# Patient Record
Sex: Female | Born: 1990 | Race: White | Hispanic: No | Marital: Single | Smoking: Never smoker
Health system: Southern US, Community
[De-identification: ages and names within clinical notes are randomized; demographics above are authoritative.]

## PROBLEM LIST (undated history)

## (undated) DIAGNOSIS — R569 Unspecified convulsions: Secondary | ICD-10-CM

## (undated) DIAGNOSIS — G43909 Migraine, unspecified, not intractable, without status migrainosus: Secondary | ICD-10-CM

## (undated) DIAGNOSIS — F840 Autistic disorder: Secondary | ICD-10-CM

## (undated) HISTORY — DX: Migraine, unspecified, not intractable, without status migrainosus: G43.909

## (undated) HISTORY — PX: WISDOM TOOTH EXTRACTION: SHX21

## (undated) HISTORY — PX: TOE SURGERY: SHX1073

## (undated) HISTORY — PX: MOUTH SURGERY: SHX715

## (undated) HISTORY — DX: Unspecified convulsions: R56.9

## (undated) HISTORY — DX: Autistic disorder: F84.0

---
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
## 2010-02-02 ENCOUNTER — Emergency Department (HOSPITAL_COMMUNITY): Admission: EM | Admit: 2010-02-02 | Discharge: 2010-02-02 | Payer: Self-pay | Admitting: Emergency Medicine

## 2010-07-13 LAB — DIFFERENTIAL
Basophils Absolute: 0 10*3/uL (ref 0.0–0.1)
Basophils Relative: 0 % (ref 0–1)
Eosinophils Absolute: 0.1 10*3/uL (ref 0.0–0.7)
Eosinophils Relative: 1 % (ref 0–5)
Lymphocytes Relative: 17 % (ref 12–46)
Lymphs Abs: 1.9 10*3/uL (ref 0.7–4.0)
Monocytes Absolute: 0 10*3/uL — ABNORMAL LOW (ref 0.1–1.0)
Monocytes Relative: 0 % — ABNORMAL LOW (ref 3–12)
Neutro Abs: 9.3 10*3/uL — ABNORMAL HIGH (ref 1.7–7.7)
Neutrophils Relative %: 82 % — ABNORMAL HIGH (ref 43–77)

## 2010-07-13 LAB — COMPREHENSIVE METABOLIC PANEL
ALT: 13 U/L (ref 0–35)
AST: 20 U/L (ref 0–37)
Albumin: 3.9 g/dL (ref 3.5–5.2)
Alkaline Phosphatase: 61 U/L (ref 39–117)
BUN: 8 mg/dL (ref 6–23)
CO2: 22 mEq/L (ref 19–32)
Calcium: 7.9 mg/dL — ABNORMAL LOW (ref 8.4–10.5)
Chloride: 114 mEq/L — ABNORMAL HIGH (ref 96–112)
Creatinine, Ser: 0.69 mg/dL (ref 0.4–1.2)
GFR calc Af Amer: >60 mL/min (ref >60)
GFR calc non Af Amer: >60 mL/min (ref >60)
Glucose, Bld: 170 mg/dL — ABNORMAL HIGH (ref 70–99)
Potassium: 3.9 mEq/L (ref 3.5–5.1)
Sodium: 140 mEq/L (ref 135–145)
Total Bilirubin: 0.2 mg/dL — ABNORMAL LOW (ref 0.3–1.2)
Total Protein: 6.7 g/dL (ref 6.0–8.3)

## 2010-07-13 LAB — CBC
HCT: 40.7 % (ref 36.0–46.0)
Hemoglobin: 13.7 g/dL (ref 12.0–15.0)
MCH: 30.5 pg (ref 26.0–34.0)
MCHC: 33.7 g/dL (ref 30.0–36.0)
MCV: 90.6 fL (ref 78.0–100.0)
Platelets: 279 10*3/uL (ref 150–400)
RBC: 4.49 MIL/uL (ref 3.87–5.11)
RDW: 12.8 % (ref 11.5–15.5)
WBC: 11.4 10*3/uL — ABNORMAL HIGH (ref 4.0–10.5)

## 2010-07-13 LAB — URINALYSIS, ROUTINE W REFLEX MICROSCOPIC
Bilirubin Urine: NEGATIVE
Glucose, UA: NEGATIVE mg/dL
Ketones, ur: NEGATIVE mg/dL
Leukocytes, UA: NEGATIVE
Nitrite: NEGATIVE
Protein, ur: NEGATIVE mg/dL
Specific Gravity, Urine: 1.014 (ref 1.005–1.030)
Urobilinogen, UA: 0.2 mg/dL (ref 0.0–1.0)
pH: 6 (ref 5.0–8.0)

## 2010-07-13 LAB — POCT PREGNANCY, URINE: Preg Test, Ur: NEGATIVE

## 2010-07-13 LAB — URINE MICROSCOPIC-ADD ON

## 2010-08-08 LAB — URINE CULTURE: Colony Count: >=100000

## 2010-08-08 LAB — URINALYSIS, ROUTINE W REFLEX MICROSCOPIC
Bilirubin Urine: NEGATIVE
Glucose, UA: NEGATIVE mg/dL
Hgb urine dipstick: NEGATIVE
Ketones, ur: NEGATIVE mg/dL
Nitrite: NEGATIVE
Protein, ur: NEGATIVE mg/dL
Specific Gravity, Urine: 1.012 (ref 1.005–1.030)
Urobilinogen, UA: 0.2 mg/dL (ref 0.0–1.0)
pH: 7 (ref 5.0–8.0)

## 2010-08-08 LAB — RAPID STREP SCREEN (MED CTR MEBANE ONLY): Streptococcus, Group A Screen (Direct): NEGATIVE

## 2010-09-15 NOTE — Discharge Summary (Signed)
[name], [name] NO.:  [id number]   MEDICAL RECORD NO.:  [id number]          PATIENT TYPE:  INP   LOCATION:  6119                         FACILITY:  [facility]   PHYSICIAN:  Broadus John T. Pickard II, MDDATE OF BIRTH:  26-Jun-1990   DATE OF ADMISSION:  09/21/2004  DATE OF DISCHARGE:  09/23/2004                                 DISCHARGE SUMMARY   PRIMARY CARE PHYSICIAN:  Dr. Dara Hoyer.   CONSULTATIONS:  Dr. Sharene Skeans of pediatric neurology.   HOSPITAL COURSE:  Patient is a 20 year old white female with a past medical  history significant for seizure disorder and autism, who was admitted with  seizures/status epilepticus and fever.  The patient had apparently missed  one of her Topamax doses and had a fever that day prior to the seizure  occurring.  Presented to the emergency department with 20-30 minutes of  seizure activity.  The seizure was finally broken with three doses of Ativan  and fosphenytoin load.  The patient was then resumed on Topamax 100 mg p.o.  b.i.d.  The patient remained seizure-free for greater than 24 hours prior to  discharge with strict and scheduled Tylenol and Motrin for fever control and  Topamax dose being resumed.  Dr. Sharene Skeans was consulted and recommended  continuation of the current Topamax dose and one-week followup as an  outpatient once the Topamax level had returned.  As far as workup for fever,  blood cultures were no growth to date, and urinalysis was initially normal.  Urine culture is pending but anticipate there to be no growth.  Chest x-ray  is negative.  A rapid strep test is pending at the time of this discharge  summary.  The patient was likely thought to have a viral illness,  considering she had multiple sick contacts with similar symptoms previously.  The patient was then discontinued on scheduled Tylenol and Motrin and  observed throughout the day to insure she had good fever control without the  scheduled antipyretics.   The patient had also been started on Clindamycin  for concern of possible aspiration pneumonia upon admission.  The  Clindamycin was discontinued the day of discharge, and the patient was  observed again another eight hours to insure the patient did not have  decompensation without the antibiotics.   OPERATIONS/PROCEDURES:  1.  Chest x-ray:  Negative.  2.  Blood culture:  No growth to date.  3.  Urinalysis was negative.  Urine cultures pending at the time of this      dictation but anticipated to be negative, considering a normal      urinalysis.  There is a Topamax level pending at the time of this      dictation and will be followed up by Dr. Sharene Skeans as an outpatient.      There is a rapid strep test that is pending at the time of this      dictation that will be followed up prior to discharge.   DIAGNOSES:  1.  Seizure disorder.  2.  Febrile illness.  3.  Cutaneous candidiasis.   MEDICATIONS:  1.  Topamax  100 mg p.o. b.i.d.  2.  Statin cream, apply t.i.d. to genital area and the affected areas.   DISCHARGE WEIGHT:  56 kg.   DISCHARGE CONDITION:  Improved.   DISCHARGE INSTRUCTIONS:  Patient is instructed to call Dr. Sharene Skeans and  schedule a follow-up appointment in 1-2 weeks.  At that appointment, Dr.  Sharene Skeans will evaluate the Topamax level and consider possibly increasing  her maintenance Topamax dose.  Dr. Sharene Skeans may also consider doing an MRI  at that point to evaluate for structural lesions in the brain.  The patient  is also instructed to call Dr. Marzetta Board office and arrange followup in the  next 3-4 weeks, as convenient.      WTP/MEDQ  D:  09/23/2004  T:  09/23/2004  Job:  [id number]   cc:   Deanna Artis. Sharene Skeans, M.D.  [address]  [address]  [place]  [place] [address]  Fax: [phone]   Teena Irani. Arlyce Dice, M.D.  [address]  [place]  [place] [address]  Fax: [phone]

## 2011-07-20 IMAGING — CT CT HEAD W/O CM
1 of 2 series · 16 of 30 positions shown, 20 images · non-contrast
Comparison: Head CT 05/20/2004.

CLINICAL DATA: Seizure.

CT HEAD WITHOUT CONTRAST
TECHNIQUE: Contiguous axial images were obtained from the base of
the skull through the vertex without contrast.

[Series 2: head_seq 4.5 h37s st · axial · 0.43mm/px · z∈[-114,+12]mm · 16 of 32 slices shown, 20 images]
[im 2/32  brain]
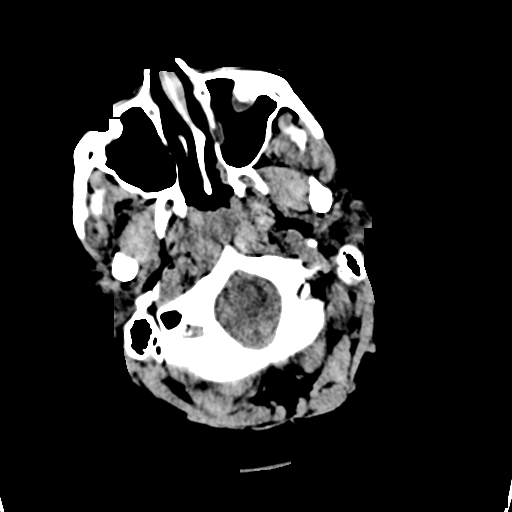
[im 2/32  bone]
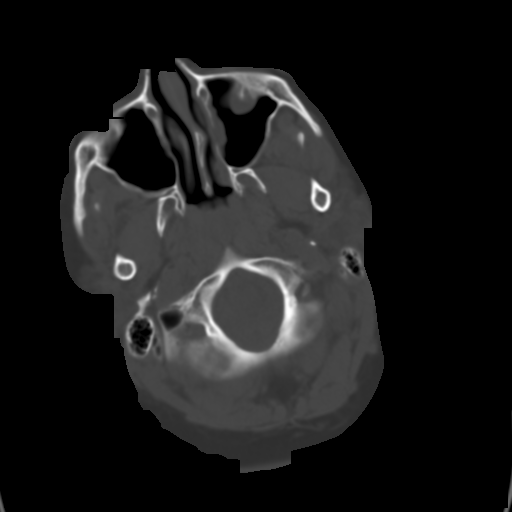
[im 3/32  brain]
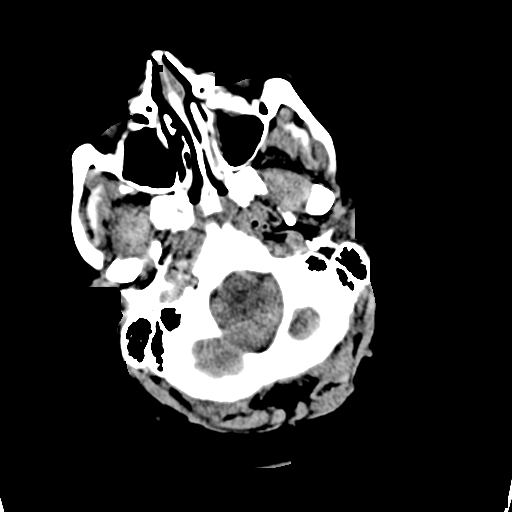
[im 6/32  brain]
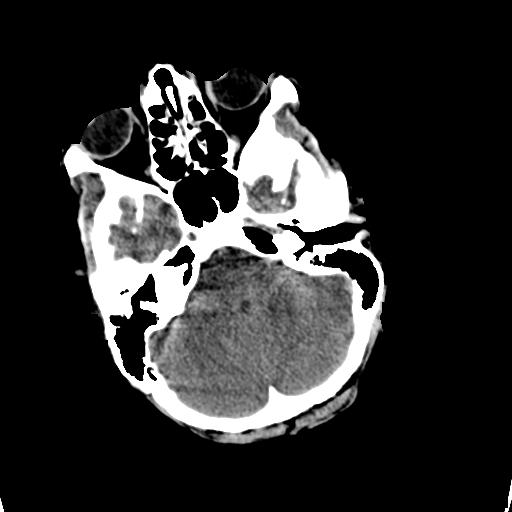
[im 8/32  brain]
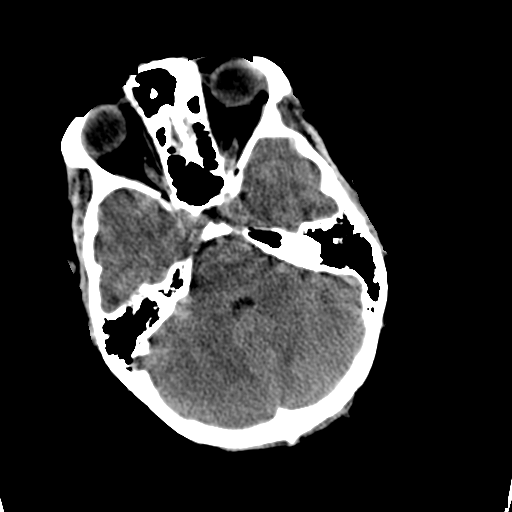
[im 9/32  brain]
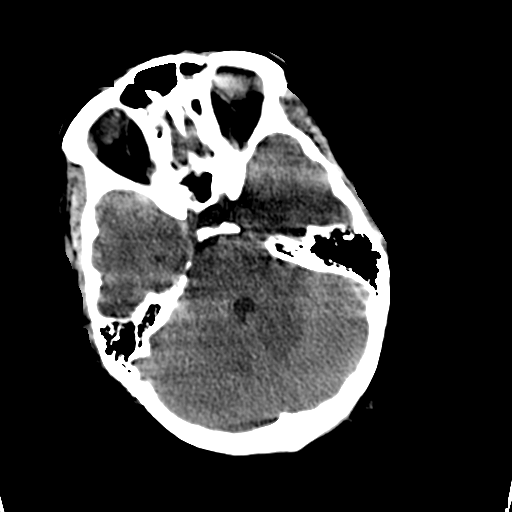
[im 9/32  bone]
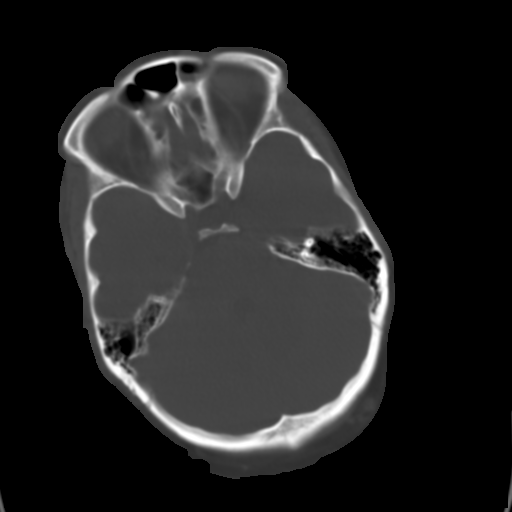
[im 11/32  brain]
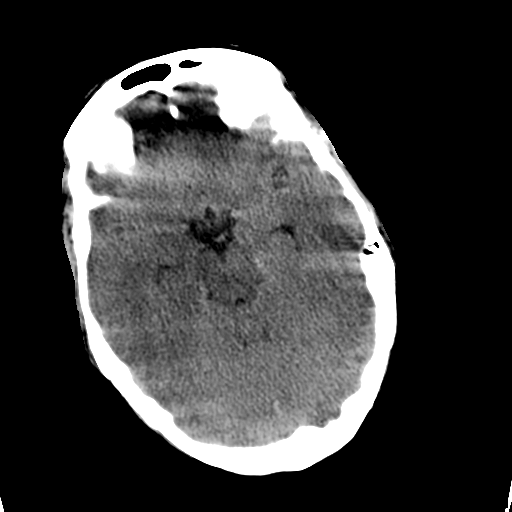
[im 14/32  brain]
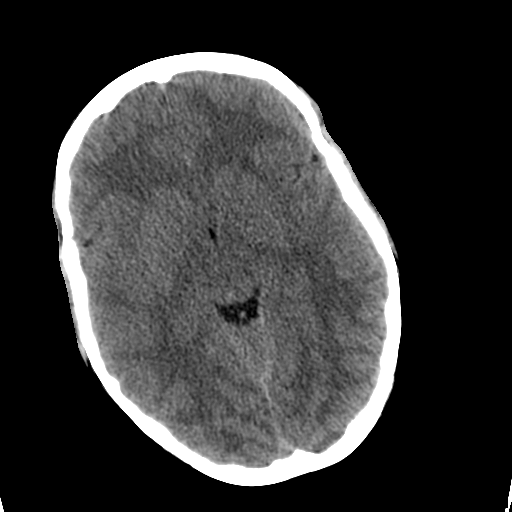
[im 15/32  brain]
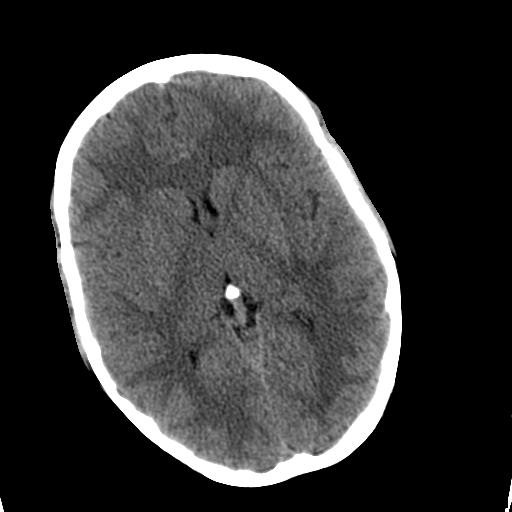
[im 17/32  brain]
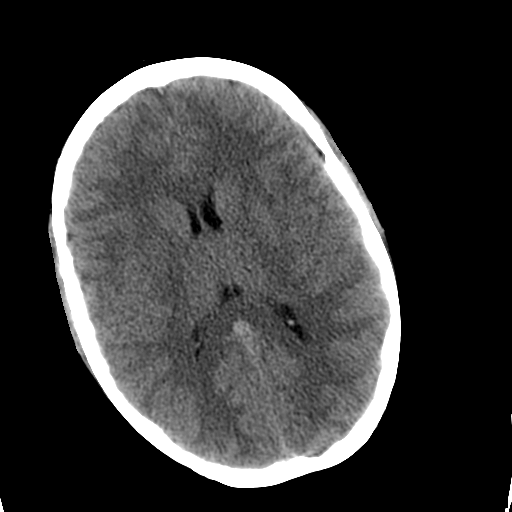
[im 17/32  bone]
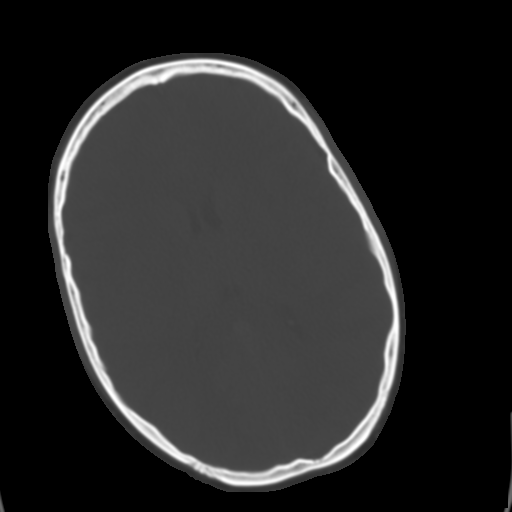
[im 18/32  brain]
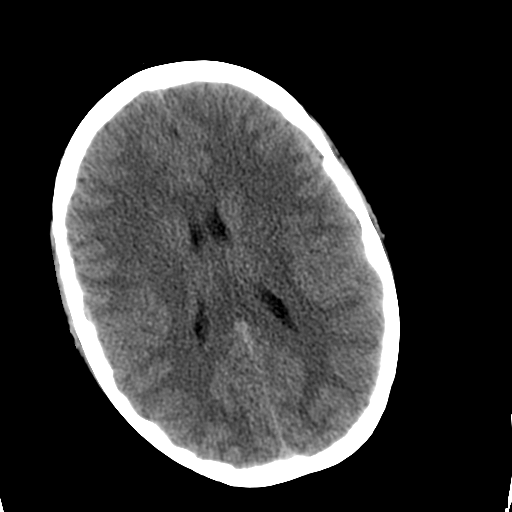
[im 21/32  brain]
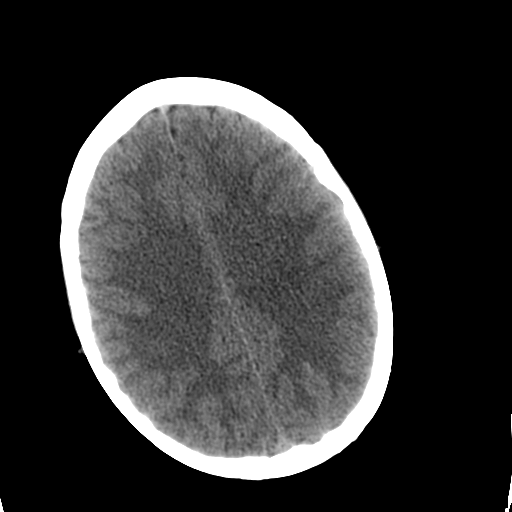
[im 23/32  brain]
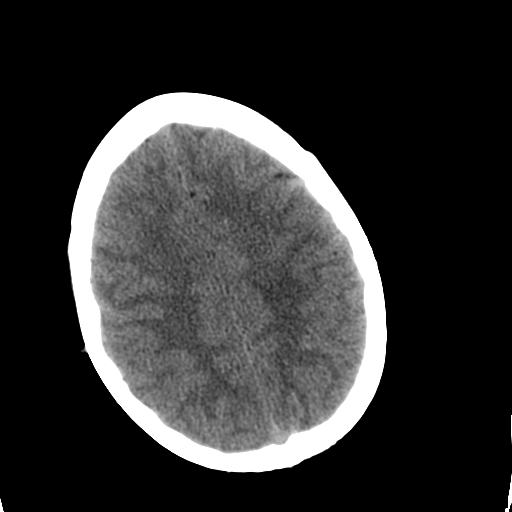
[im 24/32  brain]
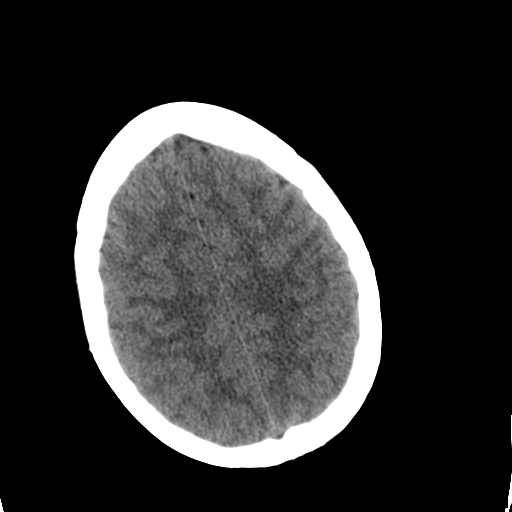
[im 24/32  bone]
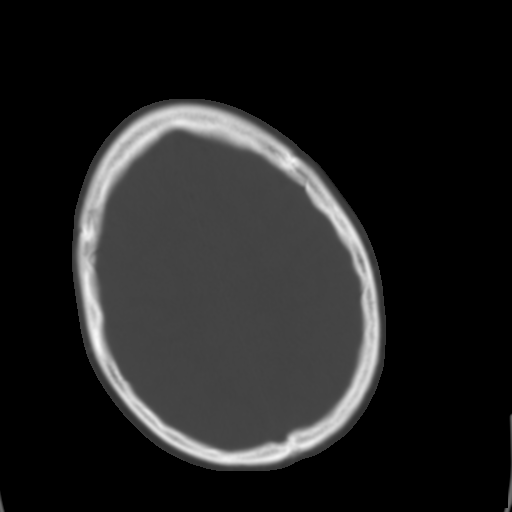
[im 26/32  brain]
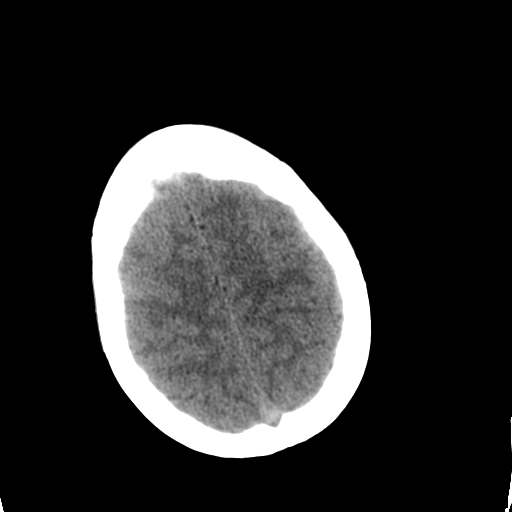
[im 29/32  brain]
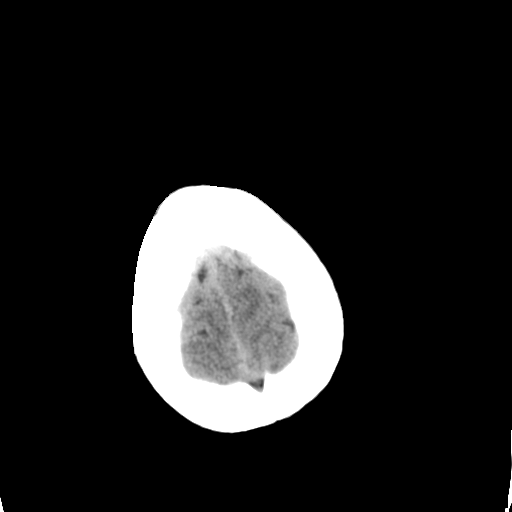
[im 30/32  brain]
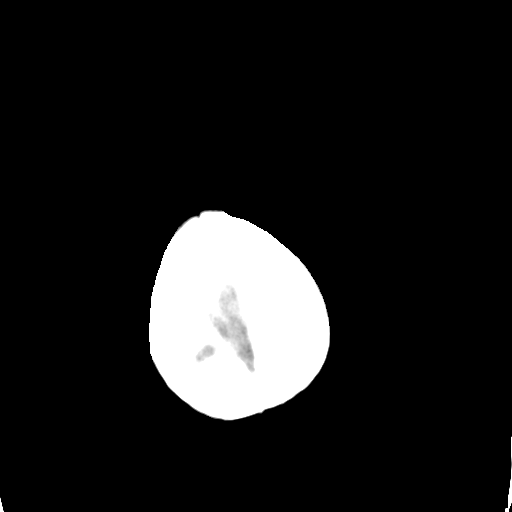

[16 of 30 positions shown; findings below may reference images not displayed]

FINDINGS: The ventricles are normal.  No extra-axial fluid
collections are seen.  The brainstem and cerebellum are
unremarkable.  No acute intracranial findings such as infarction or
hemorrhage.  No mass lesions.

The bony calvarium is intact.  The visualized paranasal sinuses and
mastoid air cells are clear.
IMPRESSION: No acute intracranial findings or mass lesions.

## 2012-10-13 ENCOUNTER — Other Ambulatory Visit: Payer: Self-pay

## 2012-10-13 DIAGNOSIS — G40401 Other generalized epilepsy and epileptic syndromes, not intractable, with status epilepticus: Secondary | ICD-10-CM

## 2012-10-13 DIAGNOSIS — G43009 Migraine without aura, not intractable, without status migrainosus: Secondary | ICD-10-CM

## 2012-10-13 DIAGNOSIS — G40309 Generalized idiopathic epilepsy and epileptic syndromes, not intractable, without status epilepticus: Secondary | ICD-10-CM

## 2012-10-13 DIAGNOSIS — G40209 Localization-related (focal) (partial) symptomatic epilepsy and epileptic syndromes with complex partial seizures, not intractable, without status epilepticus: Secondary | ICD-10-CM

## 2012-10-13 MED ORDER — LEVETIRACETAM 250 MG PO TABS: ORAL_TABLET | ORAL | Status: DC

## 2012-10-13 MED ORDER — TOPIRAMATE 100 MG PO TABS: ORAL_TABLET | ORAL | Status: DC

## 2012-11-05 ENCOUNTER — Ambulatory Visit (INDEPENDENT_AMBULATORY_CARE_PROVIDER_SITE_OTHER): Payer: BC Managed Care – PPO | Admitting: Family

## 2012-11-05 ENCOUNTER — Encounter: Payer: Self-pay | Admitting: Family

## 2012-11-05 VITALS — BP 110/70 | HR 76 | Ht 63.5 in | Wt 143.0 lb

## 2012-11-05 DIAGNOSIS — G40309 Generalized idiopathic epilepsy and epileptic syndromes, not intractable, without status epilepticus: Secondary | ICD-10-CM

## 2012-11-05 DIAGNOSIS — Z79899 Other long term (current) drug therapy: Secondary | ICD-10-CM

## 2012-11-05 DIAGNOSIS — G40209 Localization-related (focal) (partial) symptomatic epilepsy and epileptic syndromes with complex partial seizures, not intractable, without status epilepticus: Secondary | ICD-10-CM | POA: Insufficient documentation

## 2012-11-05 DIAGNOSIS — G43009 Migraine without aura, not intractable, without status migrainosus: Secondary | ICD-10-CM | POA: Insufficient documentation

## 2012-11-05 DIAGNOSIS — G40401 Other generalized epilepsy and epileptic syndromes, not intractable, with status epilepticus: Secondary | ICD-10-CM

## 2012-11-05 DIAGNOSIS — F840 Autistic disorder: Secondary | ICD-10-CM

## 2012-11-05 NOTE — Progress Notes (Signed)
Patient: [name] MRN: [id number] Sex: female DOB: [birth year]  Provider: [name], NP Location of Care: [facility]  Note type: Routine return visit  History of Present Illness: Referral Source: Dr. Velna Hatchet C. Stallings History from: Mother Chief Complaint: Epilepsy/Migraines and Autism  [name] is a 22 y.o. female with seizures, migraines and autism.  She is taking and tolerating Levetiracetam and Topiramate. She has had no seizures since February 02, 2010.   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 She has an aide that works with her in the afternoons several days per week. Rae is generally fairly even tempered but occasionally has episodes of oppositional  behavior, especially when changes occur in her routine. Mom has found that if she does not have an activity planned for the day that [name] will tend to lie on her bed most of the day, and that she seems to have a depressed mood. When she has activities, she does much better. She enjoys shopping, swimming, bowling, walking, and horseback riding. [name] has been to [employer] this summer and did well. This is her last year of school and her mother plans to investigate a day program for her after she completes her school year.    [name] has been healthy since last seen. She has maintained her weight loss from the previous year. Her mother said that she has had no overt migraines since her last visit. She is restless today and her mother says that she does not do well when she has to wait and amuse herself in the exam room.  Review of Systems: 12 system review was unremarkable  Past Medical History  Diagnosis Date  . Autism   . Seizures   . Migraines    Hospitalizations: yes, Head Injury: no, Nervous System Infections: no, Immunizations up to date: yes Past Medical History Comments: diagnosis of autism was confirmed when she was  22 years of age at the [facility] at the [facility] in [place].  She was diagnosed with undifferentiated autism.  She had a normal EEG and CT scan.  I do not know she had chromosomal evaluations.  The patient has been tried with a variety of treatments including vitamin B12, DMG, herbal therapy, angulating therapy as well as other vitamins.  These  have been of marginal benefit to this patient.  She has a history of anxiety and migraines.  School  tested [name] and found that she has an IQ in the range of 50 and undifferentiated autism.  They also noted a mixed receptive and expressive language disorder.   Status epilepticus event 02/02/2010 that occurred at school.  Surgical History Past Surgical History  Procedure Laterality Date  . Mouth surgery  Summer 2009    Family History family history includes Autoimmune disease in her maternal grandmother.  There is a family history of hypertension and diabetes in maternal grandfather, migraine headaches in the patient's mother when she was younger, & migraine headaches in her father.  She has 3 maternal second cousins that have frequent migraines.  There is a maternal second cousin with seizures.  His family history of lupus and members of the family, and Parkinson's disease and maternal great-grandfather. Family History is negative for cognitive impairment, blindness, deafness, birth defects, chromosomal disorder, or other autism.  Social History History   Social History  . Marital Status: Single    Spouse Name: N/A  Number of Children: N/A  . Years of Education: N/A   Social History Main Topics  . Smoking status: Never Smoker   . Smokeless tobacco: None  . Alcohol Use: No  . Drug Use: No  . Sexually Active: None   Other Topics Concern  . 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 She lives at home  with her parents and 3 of her 5 siblings.    Educational level 12th grade School Attending: [employer]. Occupation: Consulting civil engineer  Living with parents, 2 younger brothers and a younger sister.  School comments Jany did well her 11th grade year, she's a rising 12th grader out for summer break.  Current Outpatient Prescriptions on File Prior to Visit  Medication Sig Dispense Refill  . levETIRAcetam (KEPPRA) 250 MG tablet Take 2 tabs by mouth twice daily  120 tablet  0  . topiramate (TOPAMAX) 100 MG tablet Take 2 tabs by mouth twice daily  124 tablet  0   No current facility-administered medications on file prior to visit.   The medication list was reviewed and reconciled. All changes or newly prescribed medications were explained.  A complete medication list was provided to the patient/caregiver.  No Known Allergies  Physical Exam BP 110/70  Pulse 76  Ht 5' 3.5" (1.613 m)  Wt 143 lb (64.864 kg)  BMI 24.93 kg/m2 General: well developed, well nourished young woman, seated on exam table, in no evident distress. She had some hand flapping behaviors and covered her ears when her mother and I talked. She occasionally got off the exam table and hopped in the room. Head: head  normocephalic and atraumatic. Ears, Nose and Throat: oropharynx benign Neck: supple with no carotid or supraclavicular bruits. Respiratory: lungs clear to auscultation Cardiovascular: regular rate and rhythm, no murmurs Musculoskeletal: No deformities or scoliosis Skin: no rashes or lesions  Neurologic Exam  Mental Status: Awake and fully alert.  Able to follow simple commands. Language is limited. She was tolerant of examination.  Cranial Nerves: Fundoscopic exam reveal poorly visualized disc margins due to patient resisting examination.  Pupils equal, briskly reactive to light.  Extraocular movements appear to be full. No nystagmus was noted.  Visual fields full to confrontation.  Turns to localize  sounds bilaterally.  Face, tongue, palate move normally and symmetrically.  Neck flexion and extension normal. Motor: Normal bulk and tone.  Normal strength in all tested extremity muscles. Clumsy fine motor movements.  Sensory: appears to be intact to touch and temperature in all extremities. Coordination: Rapid alternating movements clumsy but otherwise normal in all extremities.  Finger-to-nose performed fairly well by mimicking my behavior.  Gait and Station: Arises from chair without difficulty.  Stance is slightly broad based but otherwise normal.  Gait demonstrates normal stride length.  Balance is fair.   Reflexes: Diminished and symmetric.  Toes downgoing.   Assessment and Plan Bethanne is a 22 year old young woman with history of seizures, migraines and autism. She is taking and tolerating Levetiracetam and Topiramate for her seizure disorder and has been seizure free since October 2011. She will continue her medications without change and will return for follow up in 6 months or sooner if needed.

## 2012-11-07 ENCOUNTER — Encounter: Payer: Self-pay | Admitting: Family

## 2012-11-07 NOTE — Patient Instructions (Signed)
Continue Levetiracetam and Topiramate at same doses.  Let me know if [name] has any seizures or if you have other concerns.  Plan to return for follow up in 6 months or sooner if needed.

## 2012-11-13 ENCOUNTER — Other Ambulatory Visit: Payer: Self-pay

## 2012-11-13 DIAGNOSIS — G40309 Generalized idiopathic epilepsy and epileptic syndromes, not intractable, without status epilepticus: Secondary | ICD-10-CM

## 2012-11-13 DIAGNOSIS — G40401 Other generalized epilepsy and epileptic syndromes, not intractable, with status epilepticus: Secondary | ICD-10-CM

## 2012-11-13 DIAGNOSIS — G40209 Localization-related (focal) (partial) symptomatic epilepsy and epileptic syndromes with complex partial seizures, not intractable, without status epilepticus: Secondary | ICD-10-CM

## 2012-11-13 DIAGNOSIS — G43009 Migraine without aura, not intractable, without status migrainosus: Secondary | ICD-10-CM

## 2012-11-13 MED ORDER — TOPIRAMATE 100 MG PO TABS: ORAL_TABLET | ORAL | Status: DC

## 2012-11-13 MED ORDER — LEVETIRACETAM 250 MG PO TABS: ORAL_TABLET | ORAL | Status: DC

## 2013-05-08 ENCOUNTER — Ambulatory Visit (INDEPENDENT_AMBULATORY_CARE_PROVIDER_SITE_OTHER): Payer: BC Managed Care – PPO | Admitting: Family

## 2013-05-08 ENCOUNTER — Encounter: Payer: Self-pay | Admitting: Family

## 2013-05-08 VITALS — BP 108/76 | HR 74 | Ht 63.0 in | Wt 138.4 lb

## 2013-05-08 DIAGNOSIS — Z79899 Other long term (current) drug therapy: Secondary | ICD-10-CM

## 2013-05-08 DIAGNOSIS — G40401 Other generalized epilepsy and epileptic syndromes, not intractable, with status epilepticus: Secondary | ICD-10-CM

## 2013-05-08 DIAGNOSIS — G40309 Generalized idiopathic epilepsy and epileptic syndromes, not intractable, without status epilepticus: Secondary | ICD-10-CM

## 2013-05-08 DIAGNOSIS — F840 Autistic disorder: Secondary | ICD-10-CM

## 2013-05-08 DIAGNOSIS — G40209 Localization-related (focal) (partial) symptomatic epilepsy and epileptic syndromes with complex partial seizures, not intractable, without status epilepticus: Secondary | ICD-10-CM

## 2013-05-08 DIAGNOSIS — G43009 Migraine without aura, not intractable, without status migrainosus: Secondary | ICD-10-CM

## 2013-05-08 NOTE — Progress Notes (Signed)
Patient: [name] MRN: [id number] Sex: female DOB: 1990/06/13  Provider: [name], NP Location of Care: [facility]  Note type: Routine return visit  History of Present Illness: Referral Source: Dr. Velna Hatchet C. Stallings History from: Mother Chief Complaint: Epilepsy/Migraines/Autism  [name] is a 23 y.o. female with seizures, migraines and autism. She is taking and tolerating Levetiracetam and Topiramate. She has had no seizures since February 02, 2010. 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 She has an aide that works with her in the afternoons several days per week. [name] is generally fairly even tempered but occasionally has episodes of oppositional behavior, especially when changes occur in her routine. Mom has found that if she does not have an activity planned for the day that [name] will tend to lie on her bed most of the day, and that she seems to have a depressed mood. When she has activities, she does much better. She enjoys shopping, swimming, bowling, walking, and horseback riding. She is going shopping today with her mother and is anxious to leave.  [name] has been healthy since last seen. She has lost more weight as her mother has worked at portion control and keeping her active. Her mother says that she occasionally goes to lie down on her bed with her hand over her eyes and she believes that she has a headache when she does this.  [name] has had a cough for about 5 weeks. She recently saw her PCP, who gave her mother a prescription for an antibiotic and told her that if the cough continued to give it to her. Mom is thinking about starting it as the cough is persisting.  Review of Systems: 12 system review was remarkable for cough  Past Medical History  Diagnosis Date  . Autism   . Seizures   . Migraines    Hospitalizations: yes, Head Injury: no, Nervous System  Infections: no, Immunizations up to date: no Past Medical History Comments: Diagnosis of autism was confirmed when she was 23 years of age at the [facility] at the [facility] in [place]. She was diagnosed with undifferentiated autism. She had a normal EEG and CT scan. It is not clear if she had chromosomal evaluations. The patient has been tried with a variety of treatments including vitamin B12, DMG, herbal therapy, angulating therapy as well as other vitamins. These have been of marginal benefit to this patient. She has a history of anxiety and migraines. School tested [name] and found that she has an IQ in the range of 50 and undifferentiated autism. They also noted a mixed receptive and expressive language disorder.  Status epilepticus event 02/02/2010 that occurred at school.   Surgical History Past Surgical History  Procedure Laterality Date  . Mouth surgery  Summer 2009   Family History family history includes Autoimmune disease in her maternal grandmother; Cancer in her paternal grandmother. Family History is negative migraines, seizures, cognitive impairment, blindness, deafness, birth defects, chromosomal disorder, autism.  Social History History   Social History  . Marital Status: Single    Spouse Name: N/A    Number of Children: N/A  . Years of Education: N/A   Social History Main Topics  . Smoking status: Never Smoker   . Smokeless tobacco: Never Used  . Alcohol Use: No  . Drug Use: No  . Sexual Activity: No   Other Topics Concern  . 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 She lives at home with her parents and 3 of her 5 siblings.    Educational level: special education School Attending: [employer]. Occupation: Consulting civil engineertudent  Living with parents and siblings   Hobbies/Interest: Enjoys getting her nails done, bowling,  skating and watching [employer] movies. School comments: [name] ClientHannah is in her last year of school at [employer], she's doing well and is in the [employer] program there.  Current Outpatient Prescriptions on File Prior to Visit  Medication Sig Dispense Refill  . levETIRAcetam (KEPPRA) 250 MG tablet Take 2 tabs by mouth twice daily  120 tablet  5  . topiramate (TOPAMAX) 100 MG tablet Take 2 tabs by mouth twice daily  124 tablet  5  . ondansetron (ZOFRAN-ODT) 4 MG disintegrating tablet Place 1 tablet under the tongue at onset of nausea, may repeat in 8 hours if needed       No current facility-administered medications on file prior to visit.   The medication list was reviewed and reconciled. All changes or newly prescribed medications were explained.  A complete medication list was provided to the patient/caregiver.  No Known Allergies  Physical Exam BP 108/76  Pulse 74  Ht 5\' 3"  (1.6 m)  Wt 138 lb 6.4 oz (62.778 kg)  BMI 24.52 kg/m2 General: well developed, well nourished young woman, seated on exam table, in no evident distress. She had some hand flapping behaviors and covered her ears when her mother and I talked. She occasionally got off the exam table and pulled at her mother to leave. Head: head normocephalic and atraumatic.  Ears, Nose and Throat: oropharynx benign  Neck: supple with no carotid or supraclavicular bruits.  Respiratory: lungs clear to auscultation  Cardiovascular: regular rate and rhythm, no murmurs  Musculoskeletal: No deformities or scoliosis  Skin: no rashes or lesions   Neurologic Exam  Mental Status: Awake and fully alert. Able to follow simple commands. Language is limited. She was tolerant of examination.  Cranial Nerves: Fundoscopic exam reveal poorly visualized disc margins due to patient resisting examination. Pupils equal, briskly reactive to light. Extraocular movements appear to be full. No nystagmus was noted. Visual fields full to  confrontation. Turns to localize sounds bilaterally. Face, tongue, palate move normally and symmetrically. Neck flexion and extension normal.  Motor: Normal bulk and tone. Normal strength in all tested extremity muscles. Clumsy fine motor movements.  Sensory: appears to be intact to touch and temperature in all extremities.  Coordination: Rapid alternating movements clumsy but otherwise normal in all extremities. Finger-to-nose performed fairly well by mimicking my behavior.  Gait and Station: Arises from chair without difficulty. Stance is slightly broad based but otherwise normal. Gait demonstrates normal stride length. Balance is fair.  Reflexes: Diminished and symmetric. Toes downgoing.   Assessment and Plan [name] ClientHannah is a 23 year old young woman with history of seizures, migraines and autism. She is taking and tolerating Levetiracetam and Topiramate for her seizure disorder and has been seizure free since October 2011. She will continue her medications without change and will return for follow up in 6 months or sooner if needed.

## 2013-05-09 ENCOUNTER — Encounter: Payer: Self-pay | Admitting: Family

## 2013-05-09 NOTE — Patient Instructions (Signed)
Continue [name]'s medications without change. Let me know if she has any seizures, or if she has increase in behaviors that appear to be migraines.  Please plan to return for follow up in 6 months or sooner if needed.

## 2013-05-11 ENCOUNTER — Other Ambulatory Visit: Payer: Self-pay | Admitting: Family

## 2013-08-16 DIAGNOSIS — Z0289 Encounter for other administrative examinations: Secondary | ICD-10-CM

## 2013-10-26 ENCOUNTER — Telehealth: Payer: Self-pay | Admitting: Family

## 2013-10-26 NOTE — Telephone Encounter (Signed)
[name] left a message saying that she needed a letter with [name]'s diagnosis and that she was a patient of this practice. I called Yolanda back and she asked for the letter to be mailed to her. I told Yolanda that I would mail the letter as requested. TG

## 2013-11-04 ENCOUNTER — Ambulatory Visit (INDEPENDENT_AMBULATORY_CARE_PROVIDER_SITE_OTHER): Payer: BC Managed Care – PPO | Admitting: Family

## 2013-11-04 ENCOUNTER — Encounter: Payer: Self-pay | Admitting: Family

## 2013-11-04 VITALS — BP 110/78 | HR 76 | Ht 62.5 in | Wt 144.8 lb

## 2013-11-04 DIAGNOSIS — Z79899 Other long term (current) drug therapy: Secondary | ICD-10-CM

## 2013-11-04 DIAGNOSIS — G43009 Migraine without aura, not intractable, without status migrainosus: Secondary | ICD-10-CM

## 2013-11-04 DIAGNOSIS — F840 Autistic disorder: Secondary | ICD-10-CM

## 2013-11-04 DIAGNOSIS — F411 Generalized anxiety disorder: Secondary | ICD-10-CM

## 2013-11-04 DIAGNOSIS — G40309 Generalized idiopathic epilepsy and epileptic syndromes, not intractable, without status epilepticus: Secondary | ICD-10-CM

## 2013-11-04 DIAGNOSIS — G40209 Localization-related (focal) (partial) symptomatic epilepsy and epileptic syndromes with complex partial seizures, not intractable, without status epilepticus: Secondary | ICD-10-CM

## 2013-11-04 NOTE — Progress Notes (Signed)
Patient: [name] MRN: [id number] Sex: female DOB: 07/19/1990  Provider: Elveria RisingGOODPASTURE, Taiven Greenley, NP [facility] Care[facility]  Note type: Routine return visit  History of Present Illness: Referral Source: Dr. Velna HatchetSheila C. Stallings History from: Mother Chief Complaint: Seizures/Autism  [name] is a 23 y.o. young woman with history of seizures, migraines and autism. She is taking and tolerating Levetiracetam and Topiramate. She has had no seizures since February 02, 2010. She was last seen May 08, 2013. Today her mother reports that [name] has been displaying anxious and emotional behavior in the past few months. Mom says that they have had to limit public places that they can take her because she will have "meltdowns" and become very emotionally upset for reasons that are not clear. They have not been able to identify any triggers for these episodes as she has had the behaviors in places that she is familiar with, and nothing upsetting occurred to trigger the event. There have been times when Mom has had to forcibly remove her from the location because of her behavior. [name] has also begun biting her hands at times when she is upset and she has not displayed that behavior in the past. [name] likes routine and her mother has a schedule posted with what [name] will be doing for the day. [name] has begun getting the schedule and making her mother review the schedule repeatedly, even doing this doing the night for the following day.   [name] graduated from high school in June of this year. Her mother says that the behaviors started well before she stopped going to school. She has an aide that works with her in the afternoons several days per week. She has not been aggressive with any of her meltdowns thus far.   [name] has been healthy since last seen. She occasionally goes to lie down on her bed with her hand over her eyes and her believes that she has a headache when  she does this.   Review of Systems: 12 system review was unremarkable  Past Medical History  Diagnosis Date  . Autism   . Seizures   . Migraines    Hospitalizations: No., Head Injury: No., Nervous System Infections: No., Immunizations up to date: Yes.   Past Medical History Comments: see Hx.  Surgical History Past Surgical History  Procedure Laterality Date  . Mouth surgery  Summer 2009    Family History family history includes Autoimmune disease in her maternal grandmother; Cancer in her paternal grandmother. Family History is otherwise negative for migraines, seizures, cognitive impairment, blindness, deafness, birth defects, chromosomal disorder, autism.  Social History History   Social History  . Marital Status: Single    Spouse Name: N/A    Number of Children: N/A  . Years of Education: N/A   Social History Main Topics  . Smoking status: Never Smoker   . Smokeless tobacco: Never Used  . Alcohol Use: No  . Drug Use: No  . Sexual Activity: No   Other Topics Concern  . None   Social History Narrative   She graduated from [employer] with a certificate in June 2015. She lives at home with her parents and 3 of her 5 siblings.    Educational level: 12th grade School Attending:N/A Living with:  both parents and siblings  Hobbies/Interest: watching videos, shopping, eating out and walking. School comments:  [name] graduated from [employer] in June 2015.  Physical Exam BP 110/78  Pulse 76  Ht 5' 2.5" (1.588 m)  Wt 144 lb 12.8 oz (65.681 kg)  BMI 26.05 kg/m2  LMP 11/01/2013 General: well developed, well nourished young woman, lying on exam table, in no evident distress. She had some hand flapping behaviors and covered her ears when her mother and I talked.  Head: head normocephalic and atraumatic.  Ears, Nose and Throat: oropharynx benign  Neck: supple with no carotid or supraclavicular bruits.  Respiratory: lungs clear to  auscultation  Cardiovascular: regular rate and rhythm, no murmurs  Musculoskeletal: No deformities or scoliosis  Skin: no rashes or lesions  Neurologic Exam  Mental Status: Awake and fully alert. Able to follow simple commands. Language is limited. She was echolalic at times. She was tolerant of examination.  Cranial Nerves: Fundoscopic exam reveal poorly visualized disc margins due to patient resisting examination. Pupils equal, briskly reactive to light. Extraocular movements appear to be full. No nystagmus was noted. Visual fields full to confrontation. Turns to localize sounds bilaterally. Face, tongue, palate move normally and symmetrically. Neck flexion and extension normal.  Motor: Normal bulk and tone. Normal strength in all tested extremity muscles. Clumsy fine motor movements.  Sensory: appears to be intact to touch and temperature in all extremities.  Coordination: Rapid alternating movements clumsy but otherwise normal in all extremities. Finger-to-nose performed fairly well by mimicking my behavior.  Gait and Station: Arises from chair without difficulty. Stance is slightly broad based but otherwise normal. Gait demonstrates normal stride length. Balance is fair.  Reflexes: Diminished and symmetric. Toes downgoing.   Assessment and Plan [name] is a 23 year old young woman with history of seizures, migraines and autism. She is taking and tolerating Levetiracetam and Topiramate for her seizure disorder and has been seizure free since October 2011. I talked with her mother about her behavior. I agree that she is likely displaying anxiety, and talked with her mother about ways that can be managed. She would like a referral to a therapist to help with behavioral techniques to manage it without medication if possible. I told her that I would investigate that and let her know. [name] will continue her seizure medications without change for now and will return for follow up in 6 months or sooner  if needed.

## 2013-11-04 NOTE — Patient Instructions (Signed)
Continue her medications without change for now. Let me know if [name] has any seizures.  I will check in to referring you to a therapist for [name]'s behavior and call you about that.  Please plan to return for follow up in 6 months or sooner if needed.

## 2013-11-10 ENCOUNTER — Other Ambulatory Visit: Payer: Self-pay | Admitting: Family

## 2014-05-16 ENCOUNTER — Other Ambulatory Visit: Payer: Self-pay | Admitting: Family

## 2014-05-24 ENCOUNTER — Encounter: Payer: Self-pay | Admitting: Family

## 2014-05-24 ENCOUNTER — Ambulatory Visit (INDEPENDENT_AMBULATORY_CARE_PROVIDER_SITE_OTHER): Payer: 59 | Admitting: Family

## 2014-05-24 ENCOUNTER — Ambulatory Visit: Payer: 59 | Admitting: Family

## 2014-05-24 VITALS — BP 114/70 | HR 80 | Ht 63.25 in | Wt 144.2 lb

## 2014-05-24 DIAGNOSIS — G40309 Generalized idiopathic epilepsy and epileptic syndromes, not intractable, without status epilepticus: Secondary | ICD-10-CM | POA: Diagnosis not present

## 2014-05-24 DIAGNOSIS — G40209 Localization-related (focal) (partial) symptomatic epilepsy and epileptic syndromes with complex partial seizures, not intractable, without status epilepticus: Secondary | ICD-10-CM

## 2014-05-24 DIAGNOSIS — F411 Generalized anxiety disorder: Secondary | ICD-10-CM | POA: Diagnosis not present

## 2014-05-24 DIAGNOSIS — F840 Autistic disorder: Secondary | ICD-10-CM | POA: Diagnosis not present

## 2014-05-24 DIAGNOSIS — F419 Anxiety disorder, unspecified: Secondary | ICD-10-CM | POA: Insufficient documentation

## 2014-05-24 DIAGNOSIS — F5105 Insomnia due to other mental disorder: Secondary | ICD-10-CM | POA: Diagnosis not present

## 2014-05-24 MED ORDER — CLONIDINE HCL 0.1 MG PO TABS: ORAL_TABLET | ORAL | Status: DC

## 2014-05-24 NOTE — Progress Notes (Signed)
Patient: [name] MRN: [id number] Sex: female DOB: [birth year]  Provider: [name], NP Location of Care: [facility]  Note type: Routine return visit  History of Present Illness: Referral Source: Dr. Warrick Parisian History from: her mother Chief Complaint: Seizures, Autism  [name] is a 24 y.o. young woman with history of seizures, migraines and autism. She was last seen November 04, 2013. Cerenity is taking and tolerating Levetiracetam and Topiramate for her seizure disorder and has had no seizures since February 02, 2010. Today her mother reports that [name] has been having problems with being temperamental and having more emotional behavior in the past few months. Mom says that they have had to limit public places that they can take her because she will have "meltdowns" and become very emotionally upset for reasons that are not clear. [name] has similar problems at home, but it is more common in public settings. Mom has had to forcibly remove her from stores and restaurants when [place] could not be calmed. Mom said that [name] likes to go to a store near their home, and sometimes does well in the store, but at other times, will become upset, crying out, and has to be removed. Mom says that store is quiet and that she can not find a trigger when [name] displays the behavior. [name]'s behavior is causing problems within the family, as they cannot do any outings together, as Mom may have to leave with [name] Client. Her younger sibling is becoming increasingly embarrassed by [name]'s emotional displays and now refuses to go out in public with her.   Mom says that [name] cannot tolerate being in groups, and even does not meals with the family. She takes her food to a different room and sits alone to eat. Mom tried to get her engaged in a day program but [name] could not tolerate it. She goes to a church program every other Wednesday night that is designed for  developmentally delayed persons, but she will not sitting with or doing activities with the group. At the church meeting, she sits away from the group in a partitioned space, and does the activities alone.   Mom also reports that [name] is having more trouble going to sleep. She wants her mother to sit with her and hold her hand, and sometimes this lasts for hours before she will allow her mother to leave the room. Sometimes if she awakens and Mom has gone to bed, she will go get her mother and repeat the process. Sometimes [name] wants her mother to review the next day's schedule with her repeatedly at night while she is sitting with her at bedtime. Sometimes [name] cries at night and seems anxious to her mother. Mom is fairly exhausted from sleep deprivation and dealing with [name]'s behaviors.   [name] is no longer in school. She has an aide that works with her in the afternoons several days per week. Mom says that she generally behaves for the aide but admits that they do not go out in public and that their time together is brief.   [name] has been otherwise healthy since last seen. Mom wonders if [name] would benefit from any kind of OT, PT or Speech therapy to help with her behaviors.   Review of Systems: 12 system review was unremarkable  Past Medical History  Diagnosis Date  . Autism   . Seizures   . Migraines    Hospitalizations: No., Head Injury: No., Nervous System Infections: No., Immunizations up  to date: Yes.   Past Medical History Comments: See hx.  Surgical History Past Surgical History  Procedure Laterality Date  . Mouth surgery  Summer 2009    Family History family history includes Autoimmune disease in her maternal grandmother; Cancer in her paternal grandmother. Family History is otherwise negative for migraines, seizures, cognitive impairment, blindness, deafness, birth defects, chromosomal disorder, autism.  Social History History   Social History  . Marital  Status: Single    Spouse Name: N/A    Number of Children: N/A  . Years of Education: N/A   Social History Main Topics  . Smoking status: Never Smoker   . Smokeless tobacco: Never Used  . Alcohol Use: No  . Drug Use: No  . Sexual Activity: No   Other Topics Concern  . None   Social History Narrative   She graduated from [employer] with a certificate in June 2015. She lives at home with her parents and 3 of her 5 siblings.    Living with:  mother  Hobbies/Interest: none School comments:  [name] graduated from [employer] in 2015. She does not attend a day program.  Physical Exam BP 114/70 mmHg  Pulse 80  Ht 5' 3.25" (1.607 m)  Wt 144 lb 3.2 oz (65.409 kg)  BMI 25.33 kg/m2  LMP 05/03/2014 (Approximate) General: well developed, well nourished young woman, sitting on exam table. She was restless and whimpered at times, frequently got off the table and pulled at her mother to leave.  Head: head normocephalic and atraumatic.  Ears, Nose and Throat: oropharynx benign  Neck: supple with no carotid or supraclavicular bruits.  Respiratory: lungs clear to auscultation  Cardiovascular: regular rate and rhythm, no murmurs  Musculoskeletal: No deformities or scoliosis  Skin: no rashes or lesions   Neurologic Exam  Mental Status: Awake and fully alert. Able to follow simple commands. Language is limited. She was echolalic at times. She was fairly tolerant of examination but was restless and attempted to leave the room a few times. Cranial Nerves: Fundoscopic exam reveal poorly visualized disc margins due to patient resisting examination. Pupils equal, briskly reactive to light. Extraocular movements appear to be full. No nystagmus was noted. Visual fields full to confrontation. Turns to localize sounds bilaterally. Face, tongue, palate move normally and symmetrically. Neck flexion and extension normal.  Motor: Normal bulk and tone. Normal strength  in all tested extremity muscles. Clumsy fine motor movements.  Sensory: appears to be intact to touch and temperature in all extremities.  Coordination: Rapid alternating movements clumsy but otherwise normal in all extremities. Finger-to-nose performed fairly well by mimicking my behavior.  Gait and Station: Arises from chair without difficulty. Stance is slightly broad based but otherwise normal. Gait demonstrates normal stride length. Balance is fair.  Reflexes: Diminished and symmetric. Toes downgoing.    Assessment and Plan Journie is a 24 year old young woman with history of seizures, migraines and autism. She is taking and tolerating Levetiracetam and Topiramate for her seizure disorder and has been seizure free since October 2011. I talked with her mother about her behavior. Sheniah is displaying behavior that I believe to be related to anxiety. I told her mother that OT, PT or Speech therapy would not likely help with this. We can consider a referral to behavioral health. I will investigate options for that and call her mother later this week. I recommended a trial of Clonidine to see if it will help her to get  to sleep at night. Mom agreed with these plans.

## 2014-05-26 NOTE — Patient Instructions (Signed)
To help [name] ClientHannah to sleep at night, give her Clonidine 0.1mg  - 1/2 tablet about 30 minutes before bedtime. If that is ineffective, try giving a whole tablet.   For Mee's anxiety, I will investigate a referral to behavioral health and call you later this week.   Please plan to return for follow up in 6 months or sooner if needed.

## 2014-05-28 ENCOUNTER — Telehealth: Payer: Self-pay | Admitting: Family

## 2014-05-28 DIAGNOSIS — F411 Generalized anxiety disorder: Secondary | ICD-10-CM

## 2014-05-28 MED ORDER — FLUOXETINE HCL 20 MG/5ML PO SOLN: ORAL | Status: DC

## 2014-05-28 NOTE — Telephone Encounter (Signed)
Mom called me back and I talked with her about [name]. I told her that I had consulted with Dr Sharene SkeansHickling about Teria's anxiety and that he recommended a trial of Fluoxetine. She agreed to this plan. I asked her to let me know how [name] tolerated the medication in a few weeks. We also talked about giving [name] supplemental Vitamin B6 since she is taking Levetiracetam. Mom is giving her one tablet at lunch. I recommended that she increase that to 1 tablet twice per day with food.  I sent the Rx for Fluoxetine in electronically. TG

## 2014-05-28 NOTE — Telephone Encounter (Signed)
I reviewed your note and agree with this plan, thank you. 

## 2014-05-28 NOTE — Telephone Encounter (Signed)
I left a message for Karena's mother to call me. TG

## 2014-06-18 ENCOUNTER — Other Ambulatory Visit: Payer: Self-pay | Admitting: Family

## 2014-09-17 ENCOUNTER — Telehealth: Payer: Self-pay | Admitting: Family

## 2014-09-17 NOTE — Telephone Encounter (Signed)
I reviewed your detailed note and agree with your recommendations and plan.  Thank you

## 2014-09-17 NOTE — Telephone Encounter (Signed)
Mom [name] left message about [name] saying that the recent medications prescribed for insomnia and "meltdowns" did not help, and that she may need a note for Estate manager/land agentbathroom construction for [employer]. I called Mom and she said that Clonidine did not help [name] to fall asleep at all. She said that the Prozac did not help either, and her parents felt that when she had a meltdown that it was worse while on the medication. Mom decided against refilling the medications after a 3 month trial. She asked if she could try Melatonin to help her sleep and see if better sleep improved her mood. Mom said that now, [facility] awake most of the night and then is sleepy all day. If she is not kept moving, she falls asleep during the day. Mom has arranged for an aide to come work with [name] during the day and hopes that + Melatonin will improve her sleep pattern. I told Mom that [name] could take Melatonin and talked with her about giving it early enough in the evening, preferably while it is still daylight, and explained the mechanism of action. Mom also said that the family is looking into renovating an area in to a bathroom for Yolanda Lake[place]annah. She said that their house is small and having a larger separate bathroom for [name], since she requires help with bathing and toilet hygiene, would be beneficial not only for the family but for the aide working with her. She has learned that there are [employer] waiver funds available for this and that she may need a letter from this office to get it done. I told her to provide me with information necessary for the letter and that I would write it. I asked Mom to let me know how [name] does with Melatonin and she agreed to do so. TG

## 2014-11-22 ENCOUNTER — Ambulatory Visit (INDEPENDENT_AMBULATORY_CARE_PROVIDER_SITE_OTHER): Payer: 59 | Admitting: Family

## 2014-11-22 ENCOUNTER — Encounter: Payer: Self-pay | Admitting: Family

## 2014-11-22 VITALS — BP 118/74 | HR 84 | Ht 63.0 in | Wt 153.0 lb

## 2014-11-22 DIAGNOSIS — F840 Autistic disorder: Secondary | ICD-10-CM | POA: Diagnosis not present

## 2014-11-22 DIAGNOSIS — R635 Abnormal weight gain: Secondary | ICD-10-CM | POA: Diagnosis not present

## 2014-11-22 DIAGNOSIS — F5105 Insomnia due to other mental disorder: Secondary | ICD-10-CM | POA: Diagnosis not present

## 2014-11-22 DIAGNOSIS — F419 Anxiety disorder, unspecified: Secondary | ICD-10-CM

## 2014-11-22 DIAGNOSIS — Z87898 Personal history of other specified conditions: Secondary | ICD-10-CM | POA: Insufficient documentation

## 2014-11-22 DIAGNOSIS — F411 Generalized anxiety disorder: Secondary | ICD-10-CM | POA: Diagnosis not present

## 2014-11-22 DIAGNOSIS — G40209 Localization-related (focal) (partial) symptomatic epilepsy and epileptic syndromes with complex partial seizures, not intractable, without status epilepticus: Secondary | ICD-10-CM | POA: Diagnosis not present

## 2014-11-22 DIAGNOSIS — G40309 Generalized idiopathic epilepsy and epileptic syndromes, not intractable, without status epilepticus: Secondary | ICD-10-CM | POA: Diagnosis not present

## 2014-11-22 DIAGNOSIS — G43009 Migraine without aura, not intractable, without status migrainosus: Secondary | ICD-10-CM | POA: Diagnosis not present

## 2014-11-22 NOTE — Progress Notes (Signed)
Patient: [name] MRN: [id number] Sex: female DOB: 1990/05/20  Provider: [name], NP Location of Care: [facility]  Note type: Routine return visit  History of Present Illness: Referral Source: Warrick Parisian History from: mother Chief Complaint: Seizures/Autism  [name] is a 24 y.o. young woman with history of of seizures, migraines and autism. She was last seen May 24, 2014. [name] is taking and tolerating Levetiracetam and Topiramate for her seizure disorder and has had no seizures since February 02, 2010. When she was last seen, [name] was having frequent emotional outbursts, both in public and at home. She was also having trouble going to sleep, and her mother was exhausted from sleep deprivation and dealing with [name]'s behaviors.   Today her mother reports that [name] has been calmer, in particular since a family trip to the beach in May. Mom said that she began going to sleep on her own, without calling for her other to come into her room and stay with her. She still goes to sleep very late, sometimes 1 or 2 AM, but she plays quietly in her room now. The family also went on a trip to [place], [place] to see her brother that is in the [employer] and Mom said that Nekayla did well with that trip but refused to get out of the car to go to bathroom or eat when they left [place] for the trip home. She readily got out of the car when they arrived at home.   Since her last visit, [name] had 2 dental cavities filled. Mom said that she was given Valium  prior to the procedure and that tolerated the dental repair well. She also had one episode of spontaneous vomiting. She may have had a migraine but [name] does not complain of pain so it is not clear what prompted the emesis. [name] has had episodes in the past in which she would like on the bed, appear pale and need to sleep to resume her usual activities, and it is suspected that these behaviors  constitute migraines.  Alaja has gained 9 lbs since her last visit. Mom said that [name] has been generally less active than she was at the same time last year.   [name] has been otherwise healthy since last seen and her mother has no other health concerns today other than previously mentioned. [name] is somewhat restless in the exam room and is eager to leave to go shopping at [employer].   Review of Systems: Please see the HPI for neurologic and other pertinent review of systems. Otherwise, the following systems are noncontributory including constitutional, eyes, ears, nose and throat, cardiovascular, respiratory, gastrointestinal, genitourinary, musculoskeletal, skin, endocrine, hematologic/lymph, allergic/immunologic and psychiatric.   Past Medical History  Diagnosis Date  . Autism   . Seizures   . Migraines    Hospitalizations: No., Head Injury: No., Nervous System Infections: No., Immunizations up to date: Yes.   Past Medical History Comments: see history  Surgical History Past Surgical History  Procedure Laterality Date  . Mouth surgery  Summer 2009    Family History family history includes Autoimmune disease in her maternal grandmother; Cancer in her paternal grandmother. Family History is otherwise negative for migraines, seizures, cognitive impairment, blindness, deafness, birth defects, chromosomal disorder, autism.  Social History History   Social History  . Marital Status: Single    Spouse Name: N/A  . Number of Children: N/A  . Years of Education: N/A   Social History Main Topics  .  Smoking status: Never Smoker   . Smokeless tobacco: Never Used  . Alcohol Use: No  . Drug Use: No  . Sexual Activity: No   Other Topics Concern  . None   Social History Narrative   She graduated from [employer] with a certificate in June 2015. She lives at home with her parents and 3 of her 5 siblings.    Living with:  mother, father and  siblings Hobbies/Interest: [name] enjoys watching DVD's, shopping, swimming, and going to [employer]. School comments:  [name] is not currently enrolled in a day program or employed.  Allergies No Known Allergies  Physical Exam BP 118/74 mmHg  Pulse 84  Ht 5\' 3"  (1.6 m)  Wt 153 lb (69.4 kg)  BMI 27.11 kg/m2  LMP 11/01/2014 General: well developed, well nourished young woman, sitting on exam table. She was restless and whimpered at times, frequently got off the table and pulled at her mother to leave.  Head: head normocephalic and atraumatic.  Ears, Nose and Throat: oropharynx benign  Neck: supple with no carotid or supraclavicular bruits.  Respiratory: lungs clear to auscultation  Cardiovascular: regular rate and rhythm, no murmurs  Musculoskeletal: No deformities or scoliosis  Skin: no rashes or lesions   Neurologic Exam  Mental Status: Awake and fully alert. Able to follow simple commands. Language is limited. She was echolalic at times. She was fairly tolerant of examination but was restless through the entire visit. Cranial Nerves: Fundoscopic exam reveal poorly visualized disc margins due to patient resisting examination. Pupils equal, briskly reactive to light. Extraocular movements appear to be full. No nystagmus was noted. Visual fields full to confrontation. Turns to localize sounds bilaterally. Face, tongue, palate move normally and symmetrically. Neck flexion and extension normal.  Motor: Normal bulk and tone. Normal strength in all tested extremity muscles. Clumsy fine motor movements.  Sensory: appears to be intact to touch and temperature in all extremities.  Coordination: Rapid alternating movements clumsy but otherwise normal in all extremities. Finger-to-nose performed fairly well by mimicking my behavior.  Gait and Station: Arises from chair without difficulty. Stance is slightly broad based but otherwise normal. Gait demonstrates normal stride length.  Balance is fair.  Reflexes: Diminished and symmetric. Toes downgoing.   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 Autism spectrum disorder 4. Insomnia 5. Anxiety 6. Migraine without aura 7. Weight gain  Recommendations for plan of care The patient's previous [facility] records were reviewed. Shanae has neither had nor required imaging or lab studies since the last visit.  She is a 24 year old young woman with history of seizures, migraines and autism. She is taking and tolerating Levetiracetam and Topiramate for her seizure disorder and has been seizure free since October 2011. [name]'s behavior has improved since the last visit and is more manageable. She has gained 9 lbs since her last visit and Mom and talked about limiting snacks and portion sizes, as well as ways to try to get Lorelle Formosa to be more active. She will otherwise continue her medications without change and will return for follow up in 6 months or sooner if needed.   The medication list was reviewed and reconciled.  No changes were made in the prescribed medications today.  A complete medication list was provided to Payton's mother.  Wt Readings from Last 3 Encounters:  11/22/14 153 lb (69.4 kg)  05/24/14 144 lb 3.2 oz (65.409 kg)  11/04/13 144 lb 12.8 oz (  65.681 kg)    Total time spent with the patient was 20 minutes, of which 50% or more was spent in counseling and coordination of care.

## 2014-11-22 NOTE — Patient Instructions (Signed)
Continue [name]'s medications without change for now. Let me know if she has any breakthrough seizures, or any behavior suspicious for migraine headaches.   [name] has gained 9 lbs in the last 6 months. Try to limit her intake of snacks as well as portion sizes. She also needs to be more active.   I will see her back in follow up in 6 months or sooner if needed.

## 2014-12-22 ENCOUNTER — Other Ambulatory Visit: Payer: Self-pay | Admitting: Family

## 2015-06-01 ENCOUNTER — Ambulatory Visit (INDEPENDENT_AMBULATORY_CARE_PROVIDER_SITE_OTHER): Payer: 59 | Admitting: Family

## 2015-06-01 ENCOUNTER — Encounter: Payer: Self-pay | Admitting: Family

## 2015-06-01 VITALS — BP 120/78 | HR 86 | Ht 63.0 in | Wt 160.6 lb

## 2015-06-01 DIAGNOSIS — G43009 Migraine without aura, not intractable, without status migrainosus: Secondary | ICD-10-CM | POA: Diagnosis not present

## 2015-06-01 DIAGNOSIS — G40309 Generalized idiopathic epilepsy and epileptic syndromes, not intractable, without status epilepticus: Secondary | ICD-10-CM

## 2015-06-01 DIAGNOSIS — F411 Generalized anxiety disorder: Secondary | ICD-10-CM

## 2015-06-01 DIAGNOSIS — G40209 Localization-related (focal) (partial) symptomatic epilepsy and epileptic syndromes with complex partial seizures, not intractable, without status epilepticus: Secondary | ICD-10-CM | POA: Diagnosis not present

## 2015-06-01 DIAGNOSIS — G40401 Other generalized epilepsy and epileptic syndromes, not intractable, with status epilepticus: Secondary | ICD-10-CM

## 2015-06-01 DIAGNOSIS — F840 Autistic disorder: Secondary | ICD-10-CM | POA: Diagnosis not present

## 2015-06-01 DIAGNOSIS — R635 Abnormal weight gain: Secondary | ICD-10-CM | POA: Diagnosis not present

## 2015-06-01 MED ORDER — TOPIRAMATE 100 MG PO TABS: 200.0000 mg | ORAL_TABLET | Freq: Two times a day (BID) | ORAL | Status: DC

## 2015-06-01 MED ORDER — LEVETIRACETAM 250 MG PO TABS: 500.0000 mg | ORAL_TABLET | Freq: Two times a day (BID) | ORAL | Status: DC

## 2015-06-01 NOTE — Progress Notes (Signed)
Patient: [name] MRN: [id number] Sex: female DOB: 1990-06-17  Provider: [name], NP Location of Care: [facility]  Note type: Routine return visit  History of Present Illness: Referral Source: Warrick Parisian, MD History from: mother, patient and [facility] chart Chief Complaint: Seizures/Autism  [name] is a 25 y.o. young woman with history of seizures, migraines and autism. She was last seen November 22, 2014. [name] is taking and tolerating Levetiracetam and Topiramate for her seizure disorder and has had no seizures since February 02, 2010. Early last year, [name] was having frequent emotional outbursts, both in public and at home. She was also having trouble going to sleep, and her mother was exhausted from sleep deprivation and dealing with [name]'s behaviors. Mom had some workers scheduled to help care for [name] and to take her out of the home for social activities. Mom watched [name] closely and realized that [name] had more outbursts, more anxious behavior and difficulty sleeping when the workers were present. Mom reduced the caregivers to two people, on days when Mom needed to leave the home without [name]. She did all of [name]'s care and took her on outings herself, and found that [name] was calmer, had fewer outbursts and slept better.   Mom reports today that [name] has been generally healthy since last seen. She has had episodes in the past in which she would like on the bed, appear pale and need to sleep to resume her usual activities, and it is suspected that these behaviors constitute migraines. [name] has gained 7 lbs since her last visit. Mom said that [name] has been generally less active in the winter. She said that they go to the park to walk but can only tolerate about 15 minutes before returning home because of the cold temperatures. [name] has restless behavior today with hand flapping and frequently getting of the exam table. She  readily calms when her mother soothes her, but then resumes activity a few minutes later.  [name] has been otherwise healthy since last seen and her mother has no other health concerns today other than previously mentioned.  Review of Systems: Please see the HPI for neurologic and other pertinent review of systems. Otherwise, the following systems are noncontributory including constitutional, eyes, ears, nose and throat, cardiovascular, respiratory, gastrointestinal, genitourinary, musculoskeletal, skin, endocrine, hematologic/lymph, allergic/immunologic and psychiatric.   Past Medical History  Diagnosis Date  . Autism   . Seizures (HCC)   . Migraines    Hospitalizations: No., Head Injury: No., Nervous System Infections: No., Immunizations up to date: Yes.   Past Medical History Comments: See history  Surgical History Past Surgical History  Procedure Laterality Date  . Mouth surgery  Summer 2009    Family History family history includes Autoimmune disease in her maternal grandmother; Cancer in her paternal grandmother. Family History is otherwise negative for migraines, seizures, cognitive impairment, blindness, deafness, birth defects, chromosomal disorder, autism.  Social History Social History   Social History  . Marital Status: Single    Spouse Name: N/A  . Number of Children: N/A  . Years of Education: N/A   Social History Main Topics  . Smoking status: Never Smoker   . Smokeless tobacco: Never Used  . Alcohol Use: No  . Drug Use: No  . Sexual Activity: No   Other Topics Concern  . None   Social History Narrative   She graduated from [employer] with a certificate in June 2015. She lives at home  with her parents and 2 of her 5 siblings. She enjoys shopping, bowling, going to the park for walks, and watching videos.    Allergies No Known Allergies  Physical Exam BP 120/78 mmHg  Pulse 86  Ht  (1.6 m)  Wt 160 lb 9.6 oz (72.848 kg)  BMI  28.46 kg/m2  LMP  General: well developed, well nourished young woman, sitting on exam table. She was active with self stimulatory behavior such as hand flapping today.  Head: head normocephalic and atraumatic.  Ears, Nose and Throat: oropharynx benign  Neck: supple with no carotid or supraclavicular bruits.  Respiratory: lungs clear to auscultation  Cardiovascular: regular rate and rhythm, no murmurs  Musculoskeletal: No deformities or scoliosis  Skin: no rashes or lesions   Neurologic Exam  Mental Status: Awake and fully alert. Able to follow simple commands. Language is limited. She was echolalic at times. She was fairly tolerant of examination but displayed hand flapping through the majority of visit. Cranial Nerves: Fundoscopic exam reveal poorly visualized disc margins due to patient resisting examination. Pupils equal, briskly reactive to light. Extraocular movements appear to be full. No nystagmus was noted. Visual fields full to confrontation. Turns to localize sounds bilaterally. Face, tongue, palate move normally and symmetrically. Neck flexion and extension normal.  Motor: Normal bulk and tone. Normal strength in all tested extremity muscles. Clumsy fine motor movements.  Sensory: appears to be intact to touch and temperature in all extremities.  Coordination: Rapid alternating movements clumsy but otherwise normal in all extremities. Finger-to-nose performed fairly well by mimicking my behavior.  Gait and Station: Arises from chair without difficulty. Stance is slightly broad based but otherwise normal. Gait demonstrates normal stride length. Balance is fair.  Reflexes: Diminished and symmetric. Toes downgoing.   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 Autism spectrum disorder 4. Insomnia 5. Anxiety 6. Migraine without aura 7. Weight gain   Recommendations for plan of care The patient's previous  [facility] records were reviewed. Iriana has neither had nor required imaging or lab studies since the last visit. She is a 25 year old young woman with history of seizures, migraines and autism. She is taking and tolerating Levetiracetam and Topiramate for her seizure disorder and has been seizure free since October 2011. [name]'s behavior has improved over time. She has gained 7 lbs since her last visit and I talked with Mom about limiting snacks and portion sizes, as well as ways to try to get [name] to be more active. She will otherwise continue her medications without change and will return for follow up in 6 months or sooner if needed.   Wt Readings from Last 3 Encounters:  06/01/15 160 lb 9.6 oz (72.848 kg)  11/22/14 153 lb (69.4 kg)  05/24/14 144 lb 3.2 oz (65.409 kg)    The medication list was reviewed and reconciled.  No changes were made in the prescribed medications today.  A complete medication list was provided to the patient's mother.  Total time spent with the patient was 20 minutes, of which 50% or more was spent in counseling and coordination of care.

## 2015-06-01 NOTE — Patient Instructions (Signed)
Continue giving the Levetiracetam and Topiramate as you have been giving it. Let me know if Kammy has any seizures.   I am concerned about her weight gain. Work in limiting portion sizes and snacks, as well as increasing her activity.   Please plan on returning for follow up in 6 months or sooner if needed.

## 2015-06-29 ENCOUNTER — Other Ambulatory Visit: Payer: Self-pay | Admitting: Pediatrics

## 2016-01-03 ENCOUNTER — Telehealth: Payer: Self-pay

## 2016-01-03 ENCOUNTER — Other Ambulatory Visit: Payer: Self-pay | Admitting: Family

## 2016-01-03 NOTE — Telephone Encounter (Signed)
Mom will CB to set up fu appointment

## 2016-01-03 NOTE — Telephone Encounter (Signed)
-----   Message from Elveria Risingina Goodpasture, NP sent at 01/03/2016  1:51 PM EDT ----- Regarding: Needs appointment [name] needs an appointment with me, preferably on a day that Dr Sharene SkeansHickling is in the office.  Thanks, Inetta Fermoina

## 2016-01-16 ENCOUNTER — Encounter: Payer: Self-pay | Admitting: Family

## 2016-01-16 ENCOUNTER — Ambulatory Visit (INDEPENDENT_AMBULATORY_CARE_PROVIDER_SITE_OTHER): Payer: 59 | Admitting: Family

## 2016-01-16 VITALS — BP 120/70 | HR 82 | Ht 62.75 in | Wt 153.0 lb

## 2016-01-16 DIAGNOSIS — F840 Autistic disorder: Secondary | ICD-10-CM | POA: Diagnosis not present

## 2016-01-16 DIAGNOSIS — F5105 Insomnia due to other mental disorder: Secondary | ICD-10-CM | POA: Diagnosis not present

## 2016-01-16 DIAGNOSIS — G40309 Generalized idiopathic epilepsy and epileptic syndromes, not intractable, without status epilepticus: Secondary | ICD-10-CM | POA: Diagnosis not present

## 2016-01-16 DIAGNOSIS — F411 Generalized anxiety disorder: Secondary | ICD-10-CM

## 2016-01-16 DIAGNOSIS — F419 Anxiety disorder, unspecified: Secondary | ICD-10-CM

## 2016-01-16 DIAGNOSIS — Z87898 Personal history of other specified conditions: Secondary | ICD-10-CM

## 2016-01-16 DIAGNOSIS — G40209 Localization-related (focal) (partial) symptomatic epilepsy and epileptic syndromes with complex partial seizures, not intractable, without status epilepticus: Secondary | ICD-10-CM

## 2016-01-16 NOTE — Progress Notes (Signed)
Patient: [name] Mini MRN: [id number] Sex: female DOB: 1990/06/20  Provider: Elveria Risingina Alisha Bacus, NP [facility]  Note type: Routine return visit  History of Present Illness: Referral Source: Talbot GrumblingSheila C. Creta LevinStallings, MD History [facility] and [facility] Chief Complaint: Epilepsy, Autism  [name] [name] is a 25 y.o. young woman with history of  seizures, migraines and autism. She was last seen June 01, 2015. [name] is taking and tolerating Levetiracetam and Topiramate for her seizure disorder and has had no seizures since February 02, 2010. In 2016, [name] was having frequent emotional outbursts, both in public and at home. She was also having trouble going to sleep, and her mother was exhausted from sleep deprivation and dealing with [name]'s behaviors. Mom had some workers scheduled to help care for [name] and to take her out of the home for social activities. Mom watched [name] closely and realized that [name] had more outbursts, more anxious behavior and difficulty sleeping when the workers were present. Mom reduced the caregivers to two people, on days when Mom needed to leave the home without [name]. She did all of [name]'s care and took her on outings herself, and found that [name] was calmer, had fewer outbursts and slept better.   Mom reports today that [name] has been generally calmer and seems happier than when she was last seen. She will be starting a new day program with [employer] in the near future. Mom plans for her to attend 3 days per week if [name] can tolerate that. Mom says that [name] has been otherwise healthy since last seen. She has had episodes in the past in which she would like on the bed, appear pale and need to sleep to resume her usual activities, and it is suspected that these behaviors constitute migraines. Mom says that she has had one or two of these events since she was last seen, and that Ibuprofen  and rest generally helps her to feel better. When [name] was last seen, she had gained weight. Since then, Mom has been working on reducing her snacks and working on [employer]ing [name] to be more active.   [name] is calmer and more cooperative today than at the last visit. Her mother has no other health concerns for [name] today other than previously mentioned.  Review of Systems: Please see the HPI for neurologic and other pertinent review of systems. Otherwise, the following systems are noncontributory including constitutional, eyes, ears, nose and throat, cardiovascular, respiratory, gastrointestinal, genitourinary, musculoskeletal, skin, endocrine, hematologic/lymph, allergic/immunologic and psychiatric.   Past Medical History:  Diagnosis Date  . Autism   . Migraines   . Seizures (HCC)    Hospitalizations: No., Head Injury: No., Nervous System Infections: No., Immunizations up to date: Yes.   Past Medical History Comments: See history  Surgical History Past Surgical History:  Procedure Laterality Date  . MOUTH SURGERY  Summer 2009    Family History family history includes Autoimmune disease in her maternal grandmother; Cancer in her paternal grandmother. Family History is otherwise negative for migraines, seizures, cognitive impairment, blindness, deafness, birth defects, chromosomal disorder, autism.  Social History Social History   Social History  . Marital status: Single    Spouse name: N/A  . Number of children: N/A  . Years of education: N/A   Social History Main Topics  . Smoking status: Never Smoker  . Smokeless tobacco: Never Used  . Alcohol use No  . Drug use: No  . Sexual  activity: No   Other Topics Concern  . None   Social History Narrative   She graduated from [employer] with a certificate in June 2015. She lives at home with her parents and 2 of her 5 siblings. She enjoys shopping, bowling, going to the park for walks, and watching videos.    Adele will be attending [employer].     Allergies No Known Allergies  Physical Exam BP 120/70   Pulse 82   Ht 5' 2.75" (1.594 Hooper)   Wt 153 lb (69.4 kg)   LMP 01/13/2016 (Within Days)   BMI 27.32 kg/Hooper   General: well developed, well nourished young woman, sitting on exam table. She was generally calm with occasional self stimulatory behavior such as hand flapping today.  Head: head normocephalic and atraumatic.  Ears, Nose and Throat: oropharynx benign  Neck: supple with no carotid or supraclavicular bruits.  Respiratory: lungs clear to auscultation  Cardiovascular: regular rate and rhythm, no murmurs  Musculoskeletal: No deformities or scoliosis  Skin: no rashes or lesions   Neurologic Exam  Mental Status: Awake and fully alert. Able to follow simple commands. Language is limited. She was echolalic at times. She was fairly tolerant of examination today. Cranial Nerves: Fundoscopic exam reveal poorly visualized disc margins due to patient resisting examination. Pupils equal, briskly reactive to light. Extraocular movements appear to be full. No nystagmus was noted. Visual fields full to confrontation. Turns to localize sounds bilaterally. Face, tongue, palate move normally and symmetrically. Neck flexion and extension normal.  Motor: Normal bulk and tone. Normal strength in all tested extremity muscles. Clumsy fine motor movements.  Sensory: appears to be intact to touch and temperature in all extremities.  Coordination: Rapid alternating movements clumsy but otherwise normal in all extremities. Finger-to-nose performed fairly well by mimicking my behavior.  Gait and Station: Arises from chair without difficulty. Stance is slightly broad based but otherwise normal. Gait demonstrates normal stride length. Balance is fair.  Reflexes: Diminished and symmetric. Toes downgoing.   Impression 1. Generalized convulsive epilepsy 2. Complex partial seizures with secondary  generalization 3. Autism spectrum disorder 4. Insomnia 5. Anxiety 6. Migraine without aura 7. History of weight gain   Recommendations for plan of care The patient's previous [facility] records were reviewed. Ynez has neither had nor required imaging or lab studies since the last visit. She is a 25 year old young woman with history of seizures, migraines and autism. She is taking and tolerating Levetiracetam and Topiramate for her seizure disorder and has been seizure free since October 2011. Chasty's behavior has improved over time. When she was last seen in February, she had gained weight. Mom has been working on helping Marlee to consume a healthier diet and to be more active, and she has lost 7 lbs since her last visit. I commended Mom for working with Emiko in this way and encouraged her to continue with her healthy eating plan. Maizie will otherwise continue her medications without change and will return for follow up in 6 months or sooner if needed.   Wt Readings from Last 3 Encounters:  01/16/16 153 lb (69.4 kg)  06/01/15 160 lb 9.6 oz (72.8 kg)  11/22/14 153 lb (69.4 kg)    The medication list was reviewed and reconciled.  No changes were made in the prescribed medications today.  A complete medication list was provided to the patient's mother.    Medication List       Accurate as of  01/16/16 11:46 AM. Always use your most recent med list.          diazepam 5 MG tablet Commonly known as:  VALIUM Reported on 06/01/2015   levETIRAcetam 250 MG tablet Commonly known as:  KEPPRA TAKE 2 TABLETS BY MOUTH TWICE DAILY   multivitamin with minerals Tabs tablet Take 1 tablet by mouth daily.   pyridOXINE 100 MG tablet Commonly known as:  VITAMIN B-6 Take 1 tablet twice per day with food.   topiramate 100 MG tablet Commonly known as:  TOPAMAX TAKE 2 TABLETS BY MOUTH TWICE A DAY       Total time spent with the patient was 20 minutes, of which 50% or more was spent in  counseling and coordination of care.   [name] NP-C

## 2016-01-16 NOTE — Patient Instructions (Signed)
[name] is doing well at this time. Please continue her medications as you have been giving them. Let me know if she has any seizures.   Please plan to return for follow up in 6 months or sooner if needed.

## 2016-01-17 MED ORDER — TOPIRAMATE 100 MG PO TABS: 200.0000 mg | ORAL_TABLET | Freq: Two times a day (BID) | ORAL | 5 refills | Status: DC

## 2016-01-17 MED ORDER — LEVETIRACETAM 250 MG PO TABS: 500.0000 mg | ORAL_TABLET | Freq: Two times a day (BID) | ORAL | 5 refills | Status: DC

## 2016-08-14 ENCOUNTER — Other Ambulatory Visit: Payer: Self-pay | Admitting: Family

## 2016-08-14 ENCOUNTER — Telehealth (INDEPENDENT_AMBULATORY_CARE_PROVIDER_SITE_OTHER): Payer: Self-pay | Admitting: Family

## 2016-08-14 DIAGNOSIS — G40209 Localization-related (focal) (partial) symptomatic epilepsy and epileptic syndromes with complex partial seizures, not intractable, without status epilepticus: Secondary | ICD-10-CM

## 2016-08-14 DIAGNOSIS — G40309 Generalized idiopathic epilepsy and epileptic syndromes, not intractable, without status epilepticus: Secondary | ICD-10-CM

## 2016-08-14 NOTE — Telephone Encounter (Signed)
-----   Message from [name], NP sent at 08/14/2016 11:37 AM EDT ----- Regarding: Needs appointment [name] Client needs an appointment with me.  Thanks, [name]

## 2016-08-14 NOTE — Telephone Encounter (Signed)
Appointment scheduled for August 21, 2016 at 10:30am with [name]

## 2016-08-21 ENCOUNTER — Encounter (INDEPENDENT_AMBULATORY_CARE_PROVIDER_SITE_OTHER): Payer: Self-pay | Admitting: Family

## 2016-08-21 ENCOUNTER — Ambulatory Visit (INDEPENDENT_AMBULATORY_CARE_PROVIDER_SITE_OTHER): Payer: 59 | Admitting: Family

## 2016-08-21 VITALS — BP 110/70 | HR 80 | Resp 20 | Ht 63.0 in | Wt 152.4 lb

## 2016-08-21 DIAGNOSIS — F840 Autistic disorder: Secondary | ICD-10-CM | POA: Diagnosis not present

## 2016-08-21 DIAGNOSIS — F411 Generalized anxiety disorder: Secondary | ICD-10-CM

## 2016-08-21 DIAGNOSIS — G40209 Localization-related (focal) (partial) symptomatic epilepsy and epileptic syndromes with complex partial seizures, not intractable, without status epilepticus: Secondary | ICD-10-CM

## 2016-08-21 DIAGNOSIS — G43009 Migraine without aura, not intractable, without status migrainosus: Secondary | ICD-10-CM | POA: Diagnosis not present

## 2016-08-21 DIAGNOSIS — G40309 Generalized idiopathic epilepsy and epileptic syndromes, not intractable, without status epilepticus: Secondary | ICD-10-CM | POA: Diagnosis not present

## 2016-08-21 DIAGNOSIS — Z87898 Personal history of other specified conditions: Secondary | ICD-10-CM

## 2016-08-21 NOTE — Progress Notes (Signed)
Patient: [name] MRN: [id number] Sex: female DOB: 11/26/1990  Provider: [name], NP Location of Care: [facility]  Note type: Routine return visit  History of Present Illness: Referral Source: [facility] Dr. Creta Levin no longer there History from: mother Chief Complaint: Follow up on Autism and Seizures  [name] is a 26 y.o. woman with history of seizures, migraines and autism. She was last seen January 16, 2016. [name] is taking and tolerating Levetiracetam and Topiramate for her seizure disorder and has had no seizures since February 02, 2010. Since she was last seen, [name] has started attending the day program at [facility] 3 days per week. This has worked out well, and has improved [name]'s mood and behavior. Her younger sister is also caring for her some evenings and that has been a beneficial experience for her sister as well as helpful to her mother. [name] is calmer and more cooperative today than when previously seen.   [name] has occasional episodes thought to be headaches, in which she becomes pale and goes to her bed to lie down. After resting for a short time, her color improves and she returns to her activities. Sometimes her mother gives her Tylenol when she has this behavior but sometimes simply rest will give her relief. Mom says that these episodes have been infrequent since her last visit. [name] has been otherwise generally healthy since her last visit, and her mother has no other health concerns for her today other than previously mentioned.  Review of Systems: Please see the HPI for neurologic and other pertinent review of systems. Otherwise, the following systems are noncontributory including constitutional, eyes, ears, nose and throat, cardiovascular, respiratory, gastrointestinal, genitourinary, musculoskeletal, skin, endocrine, hematologic/lymph, allergic/immunologic and  psychiatric.   Past Medical History:  Diagnosis Date  . Autism   . Migraines   . Seizures (HCC)    Hospitalizations: No., Head Injury: No., Nervous System Infections: No., Immunizations up to date: Yes.   Past Medical History Comments: See history  Surgical History Past Surgical History:  Procedure Laterality Date  . MOUTH SURGERY  Summer 2009  . WISDOM TOOTH EXTRACTION      Family History family history includes Autoimmune disease in her maternal grandmother and mother; Cancer in her paternal grandmother; Lupus in her mother; Rheumatologic disease in her mother. Family History is otherwise negative for migraines, seizures, cognitive impairment, blindness, deafness, birth defects, chromosomal disorder, autism.  Social History Social History   Social History  . Marital status: Single    Spouse name: N/A  . Number of children: N/A  . Years of education: N/A   Social History Main Topics  . Smoking status: Never Smoker  . Smokeless tobacco: Never Used  . Alcohol use No  . Drug use: No  . Sexual activity: No   Other Topics Concern  . None   Social History Narrative   She graduated from [employer] with a certificate in June 2015. She lives at home with her parents and 2 of her 5 siblings. She enjoys shopping, bowling, going to the park for walks, and watching videos.   [name] will be attending [employer].     Allergies No Known Allergies  Physical Exam BP 110/70   Pulse 80   Resp 20   Ht  (1.6 m)   Wt 152 lb 6.4 oz (69.1 kg)   LMP 08/21/2016   BMI 27.00 kg/m  General: well developed, well nourished young  woman, sitting on exam table. She was generally calm with occasional self stimulatory behavior such as hand flapping today.  Head: head normocephalic and atraumatic.  Ears, Nose and Throat: oropharynx benign  Neck: supple with no carotid or supraclavicular bruits.  Respiratory: lungs clear to auscultation  Cardiovascular:  regular rate and rhythm, no murmurs  Musculoskeletal: No deformities or scoliosis  Skin: no rashes or lesions   Neurologic Exam  Mental Status: Awake and fully alert. Able to follow simple commands. Language is limited. She was echolalic at times. She was fairly tolerant of examination today. Cranial Nerves: Fundoscopic exam reveal poorly visualized disc margins due to patient resisting examination. Pupils equal, briskly reactive to light. Extraocular movements appear to be full. No nystagmus was noted. Visual fields full to confrontation. Turns to localize sounds bilaterally. Face, tongue, palate move normally and symmetrically. Neck flexion and extension normal.  Motor: Normal bulk and tone. Normal strength in all tested extremity muscles. Clumsy fine motor movements.  Sensory: appears to be intact to touch and temperature in all extremities.  Coordination: Rapid alternating movements clumsy but otherwise normal in all extremities. Finger-to-nose performed fairly well by mimicking my behavior.  Gait and Station: Arises from chair without difficulty. Stance is slightly broad based but otherwise normal. Gait demonstrates normal stride length. Balance is fair.  Reflexes: Diminished and symmetric. Toes downgoing.   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 Autism spectrum disorder 4. Insomnia 5. Anxiety 6. Migraine without aura 7. History of weight gain  Recommendations for plan of care The patient's previous [facility] records were reviewed. [name] has neither had nor required imaging or lab studies since the last visit. She is a 26 year old young woman with history of seizures, migraines and autism. She is taking and tolerating Levetiracetam and Topiramate for her seizure disorder and has been seizure free since October 2011. [name]'s behavior has improved over time and she is currently doing well in her day program at [facility]. [place] will continue her medications without change and will return for follow up in 6 months or sooner if needed.   Wt Readings from Last 3 Encounters:  08/21/16 152 lb 6.4 oz (69.1 kg)  01/16/16 153 lb (69.4 kg)  06/01/15 160 lb 9.6 oz (72.8 kg)    The medication list was reviewed and reconciled.  No changes were made in the prescribed medications today.  A complete medication list was provided to the patient's mother.  Allergies as of 08/21/2016   No Known Allergies     Medication List       Accurate as of 08/21/16 11:59 PM. Always use your most recent med list.          diazepam 5 MG tablet Commonly known as:  VALIUM Reported on 06/01/2015   levETIRAcetam 250 MG tablet Commonly known as:  KEPPRA TAKE 2 TABLETS (500 MG TOTAL) BY MOUTH 2 (TWO) TIMES DAILY.   multivitamin with minerals Tabs tablet Take 1 tablet by mouth daily.   pyridOXINE 100 MG tablet Commonly known as:  VITAMIN B-6 Take 1 tablet twice per day with food.   topiramate 100 MG tablet Commonly known as:  TOPAMAX TAKE 2 TABLETS (200 MG TOTAL) BY MOUTH 2 (TWO) TIMES DAILY.       Total time spent with the patient was 20 minutes, of which 50% or more was spent in counseling and coordination of care.   [name] NP-C

## 2016-08-22 MED ORDER — LEVETIRACETAM 250 MG PO TABS: 500.0000 mg | ORAL_TABLET | Freq: Two times a day (BID) | ORAL | 5 refills | Status: DC

## 2016-08-22 MED ORDER — TOPIRAMATE 100 MG PO TABS: 200.0000 mg | ORAL_TABLET | Freq: Two times a day (BID) | ORAL | 5 refills | Status: DC

## 2016-08-22 NOTE — Patient Instructions (Signed)
Continue [name]'s medications as you have been giving them. Let me know if she has any seizures or if you have any concerns.   Please plan to return for follow up in 6 months or sooner if needed.

## 2016-08-31 ENCOUNTER — Telehealth (INDEPENDENT_AMBULATORY_CARE_PROVIDER_SITE_OTHER): Payer: Self-pay | Admitting: Family

## 2016-08-31 NOTE — Telephone Encounter (Signed)
°  Who's calling (name and relationship to patient) : Dennard NipCheri (mom)  Best contact number: [phone]  Provider they see: Blane OharaGoodpasture  Reason for call: Mom called about a letter she needed for the school.  If the letter can be emailed to [email or web address]    PRESCRIPTION REFILL ONLY  Name of prescription:  Pharmacy:

## 2016-08-31 NOTE — Telephone Encounter (Signed)
Call to mom Cheri- adv. [name] is out of the office today and the note she is requesting needs to be from her. She will write it on Monday when she returns. Mom agrees with plan.

## 2016-09-03 ENCOUNTER — Encounter (INDEPENDENT_AMBULATORY_CARE_PROVIDER_SITE_OTHER): Payer: Self-pay | Admitting: Family

## 2016-09-03 NOTE — Telephone Encounter (Signed)
I wrote a letter and [facility]. TG

## 2017-01-17 ENCOUNTER — Telehealth (INDEPENDENT_AMBULATORY_CARE_PROVIDER_SITE_OTHER): Payer: Self-pay | Admitting: Family

## 2017-01-17 ENCOUNTER — Encounter (INDEPENDENT_AMBULATORY_CARE_PROVIDER_SITE_OTHER): Payer: Self-pay | Admitting: Family

## 2017-01-17 NOTE — Telephone Encounter (Signed)
°  Who's calling (name and relationship to patient) : [name](mom) Best contact number: [phone] Provider they see: Goodpasture Reason for call: Daughter just had physical and labs done and just inquiring about [name] looking at those, Dr. Joelene Millin is who she saw, someone from the office just needs to do a record request so that info can be sent "mom states", their fax #[phone] Telephone [phone]  [facility]    PRESCRIPTION REFILL ONLY  Name of prescription:  Pharmacy:

## 2017-01-17 NOTE — Telephone Encounter (Signed)
I faxed the request for lab results to her PCP. TG

## 2017-01-17 NOTE — Telephone Encounter (Signed)
I called Mom and told her that it would be ok for [name] to take the Terbinafine with her seizure medications. TG

## 2017-01-17 NOTE — Telephone Encounter (Signed)
Patient's mother, Dennard Nip, called wanting to talk to [name] in regards to a possible medication conflict. She was prescribed Terbinafine, which is used for nail fungus, but was told to speak with the provider before taking since it effects the liver. Mom was wondering if it would be ok to take with the other medicines. Please advise.

## 2017-02-05 ENCOUNTER — Encounter (INDEPENDENT_AMBULATORY_CARE_PROVIDER_SITE_OTHER): Payer: Self-pay | Admitting: Family

## 2017-02-05 ENCOUNTER — Ambulatory Visit (INDEPENDENT_AMBULATORY_CARE_PROVIDER_SITE_OTHER): Payer: 59 | Admitting: Family

## 2017-02-05 VITALS — BP 106/70 | HR 84 | Ht 62.5 in | Wt 154.8 lb

## 2017-02-05 DIAGNOSIS — G40209 Localization-related (focal) (partial) symptomatic epilepsy and epileptic syndromes with complex partial seizures, not intractable, without status epilepticus: Secondary | ICD-10-CM | POA: Diagnosis not present

## 2017-02-05 DIAGNOSIS — Z87898 Personal history of other specified conditions: Secondary | ICD-10-CM | POA: Diagnosis not present

## 2017-02-05 DIAGNOSIS — F411 Generalized anxiety disorder: Secondary | ICD-10-CM

## 2017-02-05 DIAGNOSIS — F419 Anxiety disorder, unspecified: Secondary | ICD-10-CM | POA: Diagnosis not present

## 2017-02-05 DIAGNOSIS — G40309 Generalized idiopathic epilepsy and epileptic syndromes, not intractable, without status epilepticus: Secondary | ICD-10-CM | POA: Diagnosis not present

## 2017-02-05 DIAGNOSIS — F840 Autistic disorder: Secondary | ICD-10-CM

## 2017-02-05 DIAGNOSIS — F5105 Insomnia due to other mental disorder: Secondary | ICD-10-CM | POA: Diagnosis not present

## 2017-02-05 MED ORDER — TOPIRAMATE 100 MG PO TABS: 200.0000 mg | ORAL_TABLET | Freq: Two times a day (BID) | ORAL | 5 refills | Status: DC

## 2017-02-05 MED ORDER — LEVETIRACETAM 250 MG PO TABS: 500.0000 mg | ORAL_TABLET | Freq: Two times a day (BID) | ORAL | 5 refills | Status: DC

## 2017-02-05 NOTE — Progress Notes (Signed)
Patient: [name] MRN: [id number] Sex: female DOB: 1991/01/19  Provider: [name], NP Location of Care: [facility]  Note type: Routine return visit  History of Present Illness: Referral Source: Warrick Parisian, MD History from: mother, patient and [facility] chart Chief Complaint: Seizures/Autism  [name] is a 26 y.o. girl with history of seizures, migraines and autism. She was last seen August 21, 2016. [name] is taking and tolerating Levetiracetam and Topiramate for her seizure disorder and has had no seizures since February 02, 2010.   [name] lives at home with her family. She attends a day program 3 days per week and has respite workers that come in at other times to spend time with her. These combination has worked out well, and the activities have improved [name]'s mood and behaviors. [name] used to have episodes in which she would have emotional outbursts and be uncooperative, but this has markedly reduced during the time in the day program and with individualized time with her workers.   [name] has had problems with weight gain in the past but her mother works to limit her portions and snacks, as well as to make sure that she remains active. [name]'s weight has remained stable over the last year.   [name] has episodes in which she becomes pale and goes to lie down on her bed. These are thought to be migraines. Mom says that she has had none of these events since her last visit. [name] has been otherwise generally healthy since she was last seen. Mom has no other health concerns for her today other than previously mentioned.   Review of Systems: Please see the HPI for neurologic and other pertinent review of systems. Otherwise, all other systems were reviewed and were negative.    Past Medical History:  Diagnosis Date  . Autism   . Migraines   . Seizures (HCC)    Hospitalizations: No., Head Injury: No., Nervous System Infections: No.,  Immunizations up to date: Yes.   Past Medical History Comments: See history.   Surgical History Past Surgical History:  Procedure Laterality Date  . MOUTH SURGERY  Summer 2009  . WISDOM TOOTH EXTRACTION      Family History family history includes Autoimmune disease in her maternal grandmother and mother; Cancer in her paternal grandmother; Lupus in her mother; Rheumatologic disease in her mother. Family History is otherwise negative for migraines, seizures, cognitive impairment, blindness, deafness, birth defects, chromosomal disorder, autism.  Social History Social History   Social History  . Marital status: Single    Spouse name: N/A  . Number of children: N/A  . Years of education: N/A   Social History Main Topics  . Smoking status: Never Smoker  . Smokeless tobacco: Never Used  . Alcohol use No  . Drug use: No  . Sexual activity: No   Other Topics Concern  . None   Social History Narrative   She graduated from [employer] with a certificate in June 2015. She lives at home with her parents and 2 of her 5 siblings. She enjoys shopping, bowling, going to the park for walks, and watching videos.   Honore will be attending [employer]. 3 d a week    Allergies No Known Allergies  Physical Exam BP 106/70   Pulse 84   Ht 5' 2.5" (1.588 m)   Wt 154 lb 12.8 oz (70.2 kg)   BMI 27.86 kg/m  General: well developed, well nourished female, seated on  exam table, in no evident distress Head: normocephalic and atraumatic. Oropharynx benign. No dysmorphic features. Neck: supple with no carotid bruits. No focal tenderness. Cardiovascular: regular rate and rhythm, no murmurs. Respiratory: Clear to auscultation bilaterally Abdomen: Bowel sounds present all four quadrants, abdomen soft, non-tender, non-distended. No hepatosplenomegaly or masses palpated. Musculoskeletal: No skeletal deformities or obvious scoliosis Skin: no rashes or neurocutaneous  lesions  Neurologic Exam Mental Status: Awake and fully alert.  Attention span, concentration, and fund of knowledge subnormal for age. She speaks very little but when she does it is very soft and one word, prompted by her mother.  I heard her say yes and chicken today. Able to follow very simple commands. Resists some invasions into her space. Cranial Nerves: Fundoscopic exam - red reflex present.  Unable to fully visualize fundus.  Pupils equal briskly reactive to light.  Extraocular movements and visual fields appear full.  Turns to localize sounds.  Facial sensation intact.  Face, tongue, palate move normally and symmetrically.  Neck flexion and extension normal. Motor: Normal bulk and tone.  Normal strength in all tested extremity muscles. Sensory: Withdrawal x 4 Coordination: Unable to adequately test due to patient's inability to cooperate. No tremor reaching for objects.  Gait and Station: Arises from chair, without difficulty. Stance is normal.  Gait demonstrates normal stride length and balance. Reflexes: Diminished and symmetric. Toes downgoing. No clonus.  Impression 1.  Generalized convulsive epilepsy 2.  Autistic spectrum disorder 3.  Complex partial seizures with secondary generalization 4.  Anxiety 5.  Migraine without aura   Recommendations for plan of care The patient's previous [facility] records were reviewed. Jessieca has neither had nor required imaging or lab studies since the last visit. She is a 26 year old woman with seizures, autism and headaches. She is taking and tolerating Levetiracetam and Topiramate for her seizure disorder and has remained seizure free since October 2011. Her mood and behavior has improved since she has been enrolled in a day program. Mom continues to work at limiting Irisha's portion sizes and snacks, as well as finding activities to keep her active. [name]'s weight has remained stable over the last year.  Wt Readings from Last 3 Encounters:  02/05/17  154 lb 12.8 oz (70.2 kg)  08/21/16 152 lb 6.4 oz (69.1 kg)  01/16/16 153 lb (69.4 kg)   [name] is doing well at this time and will continue on her medications without change. I will see her back in follow up in 6 months or sooner if needed. Mom agreed with the plans made today.  The medication list was reviewed and reconciled.  No changes were made in the prescribed medications today.  A complete medication list was provided to the patient's mother.  Allergies as of 02/05/2017   No Known Allergies     Medication List       Accurate as of 02/05/17 11:59 PM. Always use your most recent med list.          diazepam 5 MG tablet Commonly known as:  VALIUM Reported on 06/01/2015   levETIRAcetam 250 MG tablet Commonly known as:  KEPPRA Take 2 tablets (500 mg total) by mouth 2 (two) times daily.   multivitamin with minerals Tabs tablet Take 1 tablet by mouth daily.   pyridOXINE 100 MG tablet Commonly known as:  VITAMIN B-6 Take 1 tablet twice per day with food.   topiramate 100 MG tablet Commonly known as:  TOPAMAX Take 2 tablets (200 mg total) by mouth  2 (two) times daily.       Total time spent with the patient was 20 minutes, of which 50% or more was spent in counseling and coordination of care.   [name] NP-C

## 2017-02-05 NOTE — Patient Instructions (Signed)
Thank you for coming in today.   Instructions for you until your next appointment are as follows: 1. Continue [name]'s medications as you have been giving them. 2. Her weight is stable - continue helping her to limit portions and with being active. I have printed her last 3 weights for you to see: Wt Readings from Last 3 Encounters:  02/05/17 154 lb 12.8 oz (70.2 kg)  08/21/16 152 lb 6.4 oz (69.1 kg)  01/16/16 153 lb (69.4 kg)    Please sign up for MyChart if you have not done so  Please plan to return for follow up in 6 months or sooner if needed.

## 2017-10-02 ENCOUNTER — Other Ambulatory Visit (INDEPENDENT_AMBULATORY_CARE_PROVIDER_SITE_OTHER): Payer: Self-pay | Admitting: Family

## 2017-10-02 DIAGNOSIS — G40309 Generalized idiopathic epilepsy and epileptic syndromes, not intractable, without status epilepticus: Secondary | ICD-10-CM

## 2017-10-02 DIAGNOSIS — G40209 Localization-related (focal) (partial) symptomatic epilepsy and epileptic syndromes with complex partial seizures, not intractable, without status epilepticus: Secondary | ICD-10-CM

## 2017-10-08 ENCOUNTER — Ambulatory Visit (INDEPENDENT_AMBULATORY_CARE_PROVIDER_SITE_OTHER): Payer: 59 | Admitting: Family

## 2017-10-08 ENCOUNTER — Encounter (INDEPENDENT_AMBULATORY_CARE_PROVIDER_SITE_OTHER): Payer: Self-pay | Admitting: Family

## 2017-10-08 VITALS — BP 90/60 | HR 64 | Ht 62.5 in | Wt 157.2 lb

## 2017-10-08 DIAGNOSIS — G40309 Generalized idiopathic epilepsy and epileptic syndromes, not intractable, without status epilepticus: Secondary | ICD-10-CM

## 2017-10-08 DIAGNOSIS — F411 Generalized anxiety disorder: Secondary | ICD-10-CM

## 2017-10-08 DIAGNOSIS — G40209 Localization-related (focal) (partial) symptomatic epilepsy and epileptic syndromes with complex partial seizures, not intractable, without status epilepticus: Secondary | ICD-10-CM

## 2017-10-08 DIAGNOSIS — F840 Autistic disorder: Secondary | ICD-10-CM | POA: Diagnosis not present

## 2017-10-08 DIAGNOSIS — G43009 Migraine without aura, not intractable, without status migrainosus: Secondary | ICD-10-CM | POA: Diagnosis not present

## 2017-10-08 MED ORDER — TOPIRAMATE 100 MG PO TABS: 200.0000 mg | ORAL_TABLET | Freq: Two times a day (BID) | ORAL | 5 refills | Status: DC

## 2017-10-08 MED ORDER — LEVETIRACETAM 250 MG PO TABS: 500.0000 mg | ORAL_TABLET | Freq: Two times a day (BID) | ORAL | 5 refills | Status: DC

## 2017-10-08 NOTE — Progress Notes (Signed)
Patient: [name] MRN: [id number] Sex: female DOB: January 04, 1991  Provider: [name], NP Location of Care: [facility]  Note type: Routine return visit  History of Present Illness: Referral Source: Warrick Parisian, MD History from: mother, patient and [facility] chart Chief Complaint: Seizures/Autism  [name] is a 27 y.o. young woman with history of seizures, migraines and autism. She was last seen February 05, 2017. [name] is taking and tolerating Levetiracetam and Topiramate for her seizure disorder and has remained seizure free since October 2011.    [name] has occasional episodes in which she becomes pale and goes to her bed to lie down. These events are thought to be migraine because after a nap, she is able to return to her usual activities. [name] attends a day program 3 days per week and has respite workers that come in at other times to help care for her. Her mood and behavior has improved with this schedule. Mom tells me today that the family will be going on a trip to [place] later this month but that [name] will remain at home with an aunt coming in to stay with her. [name] will continue her usual schedule of attending the day program and activities with the respite workers.   [name] has had problems with weight gain in the past but her weight has remained stable recently. Mom works to help [employer] consume a healthy diet and encourages her to walk and get exercise each day. Katora has been otherwise healthy since she was last seen. Her mother has no other health concerns for [name] today other than previously mentioned.  Review of Systems: Please see the HPI for neurologic and other pertinent review of systems. Otherwise, all other systems were reviewed and were negative.    Past Medical History:  Diagnosis Date  . Autism   . Migraines   . Seizures (HCC)    Hospitalizations: No., Head Injury: No., Nervous System Infections: No.,  Immunizations up to date: Yes.   Past Medical History Comments: See HPI   Surgical History Past Surgical History:  Procedure Laterality Date  . MOUTH SURGERY  Summer 2009  . WISDOM TOOTH EXTRACTION      Family History family history includes Autoimmune disease in her maternal grandmother and mother; Cancer in her paternal grandmother; Lupus in her mother; Rheumatologic disease in her mother. Family History is otherwise negative for migraines, seizures, cognitive impairment, blindness, deafness, birth defects, chromosomal disorder, autism.  Social History Social History   Socioeconomic History  . Marital status: Single    Spouse name: Not on file  . Number of children: Not on file  . Years of education: Not on file  . Highest education level: Not on file  Occupational History  . Not on file  Social Needs  . Financial resource strain: Not on file  . Food insecurity:    Worry: Not on file    Inability: Not on file  . Transportation needs:    Medical: Not on file    Non-medical: Not on file  Tobacco Use  . Smoking status: Never Smoker  . Smokeless tobacco: Never Used  Substance and Sexual Activity  . Alcohol use: No  . Drug use: No  . Sexual activity: Never  Lifestyle  . Physical activity:    Days per week: Not on file    Minutes per session: Not on file  . Stress: Not on file  Relationships  . Social connections:    Talks on  phone: Not on file    Gets together: Not on file    Attends religious service: Not on file    Active member of club or organization: Not on file    Attends meetings of clubs or organizations: Not on file    Relationship status: Not on file  Other Topics Concern  . Not on file  Social History Narrative   She graduated from [employer] with a certificate in June 2015. She lives at home with her parents and 2 of her 5 siblings. She enjoys shopping, bowling, going to the park for walks, and watching videos.   Yolanda ClientHannah will be  attending [employer]. 3 d a week    Allergies No Known Allergies  Physical Exam BP 90/60   Pulse 64   Ht 5' 2.5" (1.588 m)   Wt 157 lb 3.2 oz (71.3 kg)   BMI 28.29 kg/m  General: well developed, well nourished young woman, seated on exam table, in no evident distress; brown hair, brown eyes, right handed Head: normocephalic and atraumatic. Oropharynx benign. No dysmorphic features. Neck: supple with no carotid bruits. No focal tenderness. Cardiovascular: regular rate and rhythm, no murmurs. Respiratory: Clear to auscultation bilaterally Abdomen: Bowel sounds present all four quadrants, abdomen soft, non-tender, non-distended. Musculoskeletal: No skeletal deformities or obvious scoliosis Skin: no rashes or neurocutaneous lesions  Neurologic Exam Mental Status: Awake and fully alert.  Attention span, concentration, and fund of knowledge subnormal for age.  She speaks very little and when she does it is usually very soft and one word, prompted by her mother. Able to follow very simple commands. Some resistance to invasions into her space. Cranial Nerves: Fundoscopic exam - red reflex present.  Unable to fully visualize fundus.  Pupils equal briskly reactive to light.  Extraocular movements full without nystagmus.  Visual fields full to confrontation.  Hearing intact and symmetric to finger rub.  Facial sensation intact.  Face, tongue, palate move normally and symmetrically.  Neck flexion and extension normal. Motor: Normal bulk, tone and functional strength. Sensory: Withdrawal x 4.  Coordination: Unable to adequately test due to her inability to participate in examination. No tremor when reaching for objects.  Gait and Station: Arises from chair, without difficulty. Stance is normal.  Gait demonstrates normal stride length and balance. Able to walk normally. Unable to understand directions to heel, toe and tandem walk. Reflexes: Diminished and symmetric. Toes downgoing. No  clonus.  Impression 1.  Generalized convulsive epilepsy  2.  Complex partial seizures with impairment of consciousness. 3.  Autism spectrum disorder 4.  Headaches, likely migraine 5.  Anxiety   Recommendations for plan of care The [facility] were reviewed. Yolanda ClientHannah has neither had nor required imaging or lab studies since the last visit. She is a 27 year old young woman with history of seizures, autism and headaches. She has remained seizure free on Levetiracetam and Topiramate since October 2011. Yolanda ClientHannah has occasional headaches that are relieved by a nap. She is doing well at this time and will continue on her medications without change. I will see Yolanda ClientHannah back in follow up in 6 months or sooner if needed. Mom agreed with the plans made today.   The medication list was reviewed and reconciled.  No changes were made in the prescribed medications today.  A complete medication list was provided to Guy's mother.  Allergies as of 10/08/2017   No Known Allergies     Medication List  Accurate as of 10/08/17 11:59 PM. Always use your most recent med list.          diazepam 5 MG tablet Commonly known as:  VALIUM Reported on 06/01/2015   levETIRAcetam 250 MG tablet Commonly known as:  KEPPRA Take 2 tablets (500 mg total) by mouth 2 (two) times daily.   multivitamin with minerals Tabs tablet Take 1 tablet by mouth daily.   pyridOXINE 100 MG tablet Commonly known as:  VITAMIN B-6 Take 1 tablet twice per day with food.   topiramate 100 MG tablet Commonly known as:  TOPAMAX Take 2 tablets (200 mg total) by mouth 2 (two) times daily.        Total time spent with the patient was 20 minutes, of which 50% or more was spent in counseling and coordination of care.   [name] NP-C

## 2017-10-13 ENCOUNTER — Encounter (INDEPENDENT_AMBULATORY_CARE_PROVIDER_SITE_OTHER): Payer: Self-pay | Admitting: Family

## 2017-10-13 NOTE — Patient Instructions (Signed)
Thank you for coming in today.   Instructions for you until your next appointment are as follows: 1.  Continue giving the Levetiracetam and Topiramate as you have been giving it.  2.  Let me know if [name] has any seizures or if her headaches become more frequent or severe.  3.  Please sign up for MyChart if you have not done so 4.  Please plan to return for follow up in 6 months or sooner if needed.

## 2018-10-26 ENCOUNTER — Other Ambulatory Visit (INDEPENDENT_AMBULATORY_CARE_PROVIDER_SITE_OTHER): Payer: Self-pay | Admitting: Family

## 2018-10-26 DIAGNOSIS — G40309 Generalized idiopathic epilepsy and epileptic syndromes, not intractable, without status epilepticus: Secondary | ICD-10-CM

## 2018-10-26 DIAGNOSIS — G40209 Localization-related (focal) (partial) symptomatic epilepsy and epileptic syndromes with complex partial seizures, not intractable, without status epilepticus: Secondary | ICD-10-CM

## 2018-11-24 ENCOUNTER — Other Ambulatory Visit (INDEPENDENT_AMBULATORY_CARE_PROVIDER_SITE_OTHER): Payer: Self-pay | Admitting: Family

## 2018-11-24 DIAGNOSIS — G40309 Generalized idiopathic epilepsy and epileptic syndromes, not intractable, without status epilepticus: Secondary | ICD-10-CM

## 2018-11-24 DIAGNOSIS — G40209 Localization-related (focal) (partial) symptomatic epilepsy and epileptic syndromes with complex partial seizures, not intractable, without status epilepticus: Secondary | ICD-10-CM

## 2018-11-24 NOTE — Telephone Encounter (Signed)
Please call to schedule an appointment.

## 2018-11-24 NOTE — Telephone Encounter (Signed)
Scheduled appointment for 12/01/18

## 2018-12-01 ENCOUNTER — Encounter (INDEPENDENT_AMBULATORY_CARE_PROVIDER_SITE_OTHER): Payer: Self-pay | Admitting: Family

## 2018-12-01 ENCOUNTER — Ambulatory Visit (INDEPENDENT_AMBULATORY_CARE_PROVIDER_SITE_OTHER): Payer: 59 | Admitting: Family

## 2018-12-01 ENCOUNTER — Other Ambulatory Visit: Payer: Self-pay

## 2018-12-01 VITALS — BP 104/60 | HR 84 | Ht 63.0 in | Wt 168.4 lb

## 2018-12-01 DIAGNOSIS — F840 Autistic disorder: Secondary | ICD-10-CM | POA: Diagnosis not present

## 2018-12-01 DIAGNOSIS — F411 Generalized anxiety disorder: Secondary | ICD-10-CM | POA: Diagnosis not present

## 2018-12-01 DIAGNOSIS — G43009 Migraine without aura, not intractable, without status migrainosus: Secondary | ICD-10-CM

## 2018-12-01 DIAGNOSIS — G40309 Generalized idiopathic epilepsy and epileptic syndromes, not intractable, without status epilepticus: Secondary | ICD-10-CM

## 2018-12-01 MED ORDER — ALPRAZOLAM 0.25 MG PO TABS: ORAL_TABLET | ORAL | 0 refills | Status: DC

## 2018-12-01 MED ORDER — FLUOXETINE HCL 10 MG PO TABS: ORAL_TABLET | ORAL | 1 refills | Status: DC

## 2018-12-01 NOTE — Patient Instructions (Signed)
Thank you for coming in today.   Instructions for you until your next appointment are as follows: 1. Start Fluoxetine 10mg  - 1 tablet every morning. We will adjust the dose every 2 weeks as needed until she is calmer overall.  2. I have also given you a prescription for Alprazolam  0.25mg . Give her 1 tablet per day as needed for meltdowns.  3. Call me in 2 weeks to let me know how she is doing.  4. Please plan to return for follow up in 6 months or sooner if needed. We will walk by phone in the interim to adjust her medications.

## 2018-12-01 NOTE — Progress Notes (Signed)
Kendrick FriesHannah M Burruss   MRN:  [id number]  [birth year]   Provider: Elveria Risingina Breckon Reeves NP-C [facility]  Visit type: Routine Follow-Up  Last visit: 10/08/2017  Referral source: Warrick ParisianSheila Stallings, MD [facility] and patient's mother  Brief history:  History of autism, seizures and migraine headaches. She is taking and tolerating Levetiracetam and Topiramate for her seizure disorder and has remained seizure free since October 2011. She has occasional episodes in which she becomes pale and goes to her bed to lie down. These are thought to be migraine because her condition improves after a nap. These occur about once per month. She has had problems with mood and behavior, with screaming and being difficult to console. Being involved in a day program and staying on a regular schedule with daily outings has helped with this in the past.  Today's concerns: Mom reports today that [name] has remained seizure free since her last visit. She has been having increasing problems with mood and behavior, especially since she is unable to attend her day program due to Covid 19 pandemic. Mom has worked to keep her on a schedule, and takes her for a drive every day but says that [name] has been having increasing bouts of screaming than can last for 30 minutes or more. She is unable to be consoled when this occurs and is disruptive to the rest of the family because of the length of the behavior. Mom reports that these events occur several times per week.   Mom says that [name] has gained some weight because of being at home all the time due to pandemic and walking less but that [name] has been otherwise generally healthy since she was last seen. Mom has no other health concerns for [name] today other then previously mentioned.   Review of systems: Please see HPI for neurologic and other pertinent review of systems. Otherwise all other systems were reviewed and were  negative.  Problem List: Patient Active Problem List   Diagnosis Date Noted  . History of weight gain 11/22/2014  . 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 Anxiety state 11/04/2013  . 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 Partial epilepsy with impairment of consciousness (HCC) 11/05/2012  . Active autistic disorder 11/05/2012  . Migraine without aura 11/05/2012  . Autism spectrum disorder 11/05/2012     Past Medical History:  Diagnosis Date  . Autism   . Migraines   . Seizures (HCC)     Past medical history comments: See HPI Copied from previous record diagnosis of autism was confirmed when she was 28 years of age [facility] [place].  She was diagnosed with undifferentiated autism.  She had a normal EEG and CT scan.  I do not know she had chromosomal evaluations.  The patient has been tried with a variety of treatments including vitamin B12, DMG, herbal therapy, angulating therapy as well as other vitamins.  These  have been of marginal benefit to this patient.  She has a history of anxiety and migraines.  School  tested [name] and found that she has an IQ in the range of 50 and undifferentiated autism.  They also noted a mixed receptive and expressive language disorder.   Status epilepticus event May 2006  Status epilepticus event 02/02/2010 that occurred at school.  Surgical history: Past Surgical History:  Procedure Laterality Date  .  MOUTH SURGERY  Summer 2009  . WISDOM TOOTH EXTRACTION       Family history: family history includes Autoimmune disease in her maternal grandmother and mother; Cancer in her paternal grandmother; Lupus in her mother; Rheumatologic disease in her mother.   Social history: Social History   Socioeconomic History  . Marital status: Single    Spouse name: Not on file  . Number of children: Not on file  . Years of  education: Not on file  . Highest education level: Not on file  Occupational History  . Not on file  Social Needs  . Financial resource strain: Not on file  . Food insecurity    Worry: Not on file    Inability: Not on file  . Transportation needs    Medical: Not on file    Non-medical: Not on file  Tobacco Use  . Smoking status: Never Smoker  . Smokeless tobacco: Never Used  Substance and Sexual Activity  . Alcohol use: No  . Drug use: No  . Sexual activity: Never  Lifestyle  . Physical activity    Days per week: Not on file    Minutes per session: Not on file  . Stress: Not on file  Relationships  . Social Musicianconnections    Talks on phone: Not on file    Gets together: Not on file    Attends religious service: Not on file    Active member of club or organization: Not on file    Attends meetings of clubs or organizations: Not on file    Relationship status: Not on file  . Intimate partner violence    Fear of current or ex partner: Not on file    Emotionally abused: Not on file    Physically abused: Not on file    Forced sexual activity: Not on file  Other Topics Concern  . Not on file  Social History Narrative   She graduated from [employer] with a certificate in June 2015. She lives at home with her parents and 2 of her 5 siblings. She enjoys shopping, bowling, going to the park for walks, and watching videos.   [name] will be attending [employer]. 3 d a week      Past/failed meds: none  Allergies: No Known Allergies    Immunizations:  There is no immunization history on file for this patient.    Diagnostics/Screenings: rEEG - 10/21/2003 - normal awake. W.Hickling, MD  rEEG - 02/06/2010 - normal awake - W.Hickling, MD  Physical Exam: BP 104/60   Pulse 84   Ht 5\' 3"  (1.6 m)   Wt 168 lb 6.4 oz (76.4 kg)   BMI 29.83 kg/m   General: Well developed, well nourished young woman, seated on exam table, in no evident distress, brown  hair, brown eyes, right handed Head: Head normocephalic and atraumatic.  Oropharynx benign. Neck: Supple with no carotid bruits Cardiovascular: Regular rate and rhythm, no murmurs Respiratory: Breath sounds clear to auscultation Musculoskeletal: No obvious deformities or scoliosis Skin: No rashes or neurocutaneous lesions  Neurologic Exam Mental Status: Awake and fully alert. Attention span and concentration subnormal for age. Language is very limited. When she speaks, it is very soft and usually a word prompted by her mother. Able to follow very simple directions. Some resistance to invasions into her space. Restless, alternating sitting up and lying down on the exam table.  Cranial Nerves: Fundoscopic exam - red reflex present. Unable to fully visualize fundus.  Pupils equal, briskly reactive to light.  Extraocular movements appear full without nystagmus.  Turns to localize sounds in the periphery. Facial sensation intact.  Face tongue, palate move normally and symmetrically.  Neck flexion and extension normal. Motor: Normal functional bulk, tone and strength Sensory: Withdrawal x 4. Coordination: Unable to adequately assess due to her inability to participate in examination. No tremor when reaching for objects.  Gait and Station: Arises from chair without difficulty.  Stance is normal. Gait demonstrates normal stride length and balance. Unable to follow directions to heel, toe and tandem walk. Reflexes: Unable to adequately assess due to her inability to cooperate with examination  Impression: 1. Generalized convulsive epilepsy 2. 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 Autism spectrum disorder 4. Headaches, likely migraine 5. Anxiety  Recommendations for plan of care: The patient's previous [facility] records were reviewed. Anzlee has neither had nor required imaging or lab studies since the last visit. She is a 28 year old woman with history of autism, epilepsy, headaches  and anxiety. She has remained seizure free on Topiramate and Levetiracetam since 2011. Brighton is experiencing bouts of screaming behavior that likely represents anxiety. I recommended to Mom that we try Fluoxetine to see if it will help her to be less anxious and reduce screaming behavior. I talked with her about starting it at low dose, and increasing every 2 weeks until behavior has improved or side effects are noted. I also gave a prescription for Alprazolam to be used sparingly when screaming events occur. I asked Mom to follow up with me by phone in 2 weeks. We talked about her weight gain and asked Mom to try to limit snacks and find ways for Marge to be more active.  I will otherwise see Saralee back in follow up in 6 months or sooner if needed. Mom agreed with the plans made today.   Wt Readings from Last 3 Encounters:  12/01/18 168 lb 6.4 oz (76.4 kg)  10/08/17 157 lb 3.2 oz (71.3 kg)  02/05/17 154 lb 12.8 oz (70.2 kg)    The medication list was reviewed and reconciled. No changes were made in the prescribed medications today. A complete medication list was provided to the patient.  Allergies as of 12/01/2018   No Known Allergies     Medication List       Accurate as of December 01, 2018 11:59 PM. If you have any questions, ask your nurse or doctor.        ALPRAZolam 0.25 MG tablet Commonly known as: XANAX Take 1 tablet per day as needed for anxiety Started by: Rockwell Germany, NP   diazepam 5 MG tablet Commonly known as: VALIUM Reported on 06/01/2015   FLUoxetine 10 MG tablet Commonly known as: PROZAC Take 1 tablet every morning Started by: Rockwell Germany, NP   levETIRAcetam 250 MG tablet Commonly known as: KEPPRA TAKE 2 TABLETS BY MOUTH TWICE A DAY   multivitamin with minerals Tabs tablet Take 1 tablet by mouth daily.   pyridOXINE 100 MG tablet Commonly known as: VITAMIN B-6 Take 1 tablet twice per day with food.   topiramate 100 MG tablet Commonly known as:  TOPAMAX TAKE 2 TABLETS (200 MG TOTAL) BY MOUTH 2 (TWO) TIMES DAILY.        Total time spent with the patient was 30 minutes, of which 50% or more was spent in counseling and coordination of care.  Rockwell Germany NP-C [facility] Ph. [phone] Fax [phone]

## 2018-12-03 ENCOUNTER — Encounter (INDEPENDENT_AMBULATORY_CARE_PROVIDER_SITE_OTHER): Payer: Self-pay | Admitting: Family

## 2018-12-24 ENCOUNTER — Other Ambulatory Visit (INDEPENDENT_AMBULATORY_CARE_PROVIDER_SITE_OTHER): Payer: Self-pay | Admitting: Family

## 2018-12-24 DIAGNOSIS — G40309 Generalized idiopathic epilepsy and epileptic syndromes, not intractable, without status epilepticus: Secondary | ICD-10-CM

## 2018-12-24 DIAGNOSIS — G40209 Localization-related (focal) (partial) symptomatic epilepsy and epileptic syndromes with complex partial seizures, not intractable, without status epilepticus: Secondary | ICD-10-CM

## 2019-01-22 ENCOUNTER — Telehealth (INDEPENDENT_AMBULATORY_CARE_PROVIDER_SITE_OTHER): Payer: Self-pay | Admitting: Family

## 2019-01-22 ENCOUNTER — Other Ambulatory Visit (INDEPENDENT_AMBULATORY_CARE_PROVIDER_SITE_OTHER): Payer: Self-pay | Admitting: Family

## 2019-01-22 DIAGNOSIS — F840 Autistic disorder: Secondary | ICD-10-CM

## 2019-01-22 DIAGNOSIS — F411 Generalized anxiety disorder: Secondary | ICD-10-CM

## 2019-01-22 MED ORDER — FLUOXETINE HCL 10 MG PO TABS: ORAL_TABLET | ORAL | 1 refills | Status: DC

## 2019-01-22 NOTE — Telephone Encounter (Signed)
°  Who's calling (name and relationship to patient) : Carmel Sacramento (Mother)  Best contact number: [phone] Provider they see: Otila Kluver Reason for call: Mother requesting refill on pt's medication.      PRESCRIPTION REFILL ONLY  Name of prescription: Fluoxetine [facility] [place] ([address] [place]

## 2019-01-22 NOTE — Telephone Encounter (Signed)
Rx has been sent to the pharmacy

## 2019-01-26 ENCOUNTER — Other Ambulatory Visit (INDEPENDENT_AMBULATORY_CARE_PROVIDER_SITE_OTHER): Payer: Self-pay | Admitting: Family

## 2019-01-26 DIAGNOSIS — G40309 Generalized idiopathic epilepsy and epileptic syndromes, not intractable, without status epilepticus: Secondary | ICD-10-CM

## 2019-01-26 DIAGNOSIS — G40209 Localization-related (focal) (partial) symptomatic epilepsy and epileptic syndromes with complex partial seizures, not intractable, without status epilepticus: Secondary | ICD-10-CM

## 2019-02-28 ENCOUNTER — Other Ambulatory Visit (INDEPENDENT_AMBULATORY_CARE_PROVIDER_SITE_OTHER): Payer: Self-pay | Admitting: Pediatrics

## 2019-02-28 DIAGNOSIS — G40209 Localization-related (focal) (partial) symptomatic epilepsy and epileptic syndromes with complex partial seizures, not intractable, without status epilepticus: Secondary | ICD-10-CM

## 2019-02-28 DIAGNOSIS — G40309 Generalized idiopathic epilepsy and epileptic syndromes, not intractable, without status epilepticus: Secondary | ICD-10-CM

## 2019-03-31 ENCOUNTER — Other Ambulatory Visit (INDEPENDENT_AMBULATORY_CARE_PROVIDER_SITE_OTHER): Payer: Self-pay | Admitting: Family

## 2019-03-31 DIAGNOSIS — F840 Autistic disorder: Secondary | ICD-10-CM

## 2019-03-31 DIAGNOSIS — G40209 Localization-related (focal) (partial) symptomatic epilepsy and epileptic syndromes with complex partial seizures, not intractable, without status epilepticus: Secondary | ICD-10-CM

## 2019-03-31 DIAGNOSIS — G40309 Generalized idiopathic epilepsy and epileptic syndromes, not intractable, without status epilepticus: Secondary | ICD-10-CM

## 2019-03-31 DIAGNOSIS — F411 Generalized anxiety disorder: Secondary | ICD-10-CM

## 2019-04-20 ENCOUNTER — Other Ambulatory Visit (INDEPENDENT_AMBULATORY_CARE_PROVIDER_SITE_OTHER): Payer: Self-pay | Admitting: Family

## 2019-04-20 DIAGNOSIS — F411 Generalized anxiety disorder: Secondary | ICD-10-CM

## 2019-04-20 DIAGNOSIS — F840 Autistic disorder: Secondary | ICD-10-CM

## 2019-04-28 ENCOUNTER — Other Ambulatory Visit (INDEPENDENT_AMBULATORY_CARE_PROVIDER_SITE_OTHER): Payer: Self-pay | Admitting: Family

## 2019-04-28 DIAGNOSIS — G40209 Localization-related (focal) (partial) symptomatic epilepsy and epileptic syndromes with complex partial seizures, not intractable, without status epilepticus: Secondary | ICD-10-CM

## 2019-04-28 DIAGNOSIS — G40309 Generalized idiopathic epilepsy and epileptic syndromes, not intractable, without status epilepticus: Secondary | ICD-10-CM

## 2019-06-01 ENCOUNTER — Other Ambulatory Visit (INDEPENDENT_AMBULATORY_CARE_PROVIDER_SITE_OTHER): Payer: Self-pay | Admitting: Family

## 2019-06-01 DIAGNOSIS — G40309 Generalized idiopathic epilepsy and epileptic syndromes, not intractable, without status epilepticus: Secondary | ICD-10-CM

## 2019-06-01 DIAGNOSIS — G40209 Localization-related (focal) (partial) symptomatic epilepsy and epileptic syndromes with complex partial seizures, not intractable, without status epilepticus: Secondary | ICD-10-CM

## 2019-06-19 ENCOUNTER — Telehealth (INDEPENDENT_AMBULATORY_CARE_PROVIDER_SITE_OTHER): Payer: Self-pay | Admitting: Family

## 2019-06-19 DIAGNOSIS — F840 Autistic disorder: Secondary | ICD-10-CM

## 2019-06-19 DIAGNOSIS — F411 Generalized anxiety disorder: Secondary | ICD-10-CM

## 2019-06-19 MED ORDER — FLUOXETINE HCL 20 MG PO TABS: ORAL_TABLET | ORAL | 5 refills | Status: DC

## 2019-06-19 NOTE — Telephone Encounter (Signed)
  Who's calling (name and relationship to patient) : Cirigliano,Cheri Best contact number: [phone] Provider they see: Blane Ohara Reason for call: Mom left a vm at 2:55, she request [name] or a clinic staff to please call her to discuss the medications Zarianna is taking.     PRESCRIPTION REFILL ONLY  Name of prescription:  Pharmacy:

## 2019-06-19 NOTE — Telephone Encounter (Signed)
I called and talked with [name]. She said that [name] had initially done well on Fluoxetine 10mg  but over the last month or so has exhibited more anxiety. She asked if the dose could increase. I recommended increasing to 20mg  per day and sent in an updated prescription. I asked [name] to let me know if [name]'s mood improved on the increased dose. TG

## 2019-06-25 ENCOUNTER — Other Ambulatory Visit (INDEPENDENT_AMBULATORY_CARE_PROVIDER_SITE_OTHER): Payer: Self-pay | Admitting: Family

## 2019-06-25 DIAGNOSIS — F411 Generalized anxiety disorder: Secondary | ICD-10-CM

## 2019-06-25 DIAGNOSIS — F840 Autistic disorder: Secondary | ICD-10-CM

## 2019-07-06 ENCOUNTER — Other Ambulatory Visit (INDEPENDENT_AMBULATORY_CARE_PROVIDER_SITE_OTHER): Payer: Self-pay | Admitting: Family

## 2019-07-06 DIAGNOSIS — G40209 Localization-related (focal) (partial) symptomatic epilepsy and epileptic syndromes with complex partial seizures, not intractable, without status epilepticus: Secondary | ICD-10-CM

## 2019-07-06 DIAGNOSIS — F840 Autistic disorder: Secondary | ICD-10-CM

## 2019-07-06 DIAGNOSIS — F411 Generalized anxiety disorder: Secondary | ICD-10-CM

## 2019-07-06 DIAGNOSIS — G40309 Generalized idiopathic epilepsy and epileptic syndromes, not intractable, without status epilepticus: Secondary | ICD-10-CM

## 2019-08-03 ENCOUNTER — Other Ambulatory Visit (INDEPENDENT_AMBULATORY_CARE_PROVIDER_SITE_OTHER): Payer: Self-pay | Admitting: Family

## 2019-08-03 DIAGNOSIS — G40209 Localization-related (focal) (partial) symptomatic epilepsy and epileptic syndromes with complex partial seizures, not intractable, without status epilepticus: Secondary | ICD-10-CM

## 2019-08-03 DIAGNOSIS — G40309 Generalized idiopathic epilepsy and epileptic syndromes, not intractable, without status epilepticus: Secondary | ICD-10-CM

## 2019-08-06 ENCOUNTER — Other Ambulatory Visit: Payer: Self-pay

## 2019-08-06 ENCOUNTER — Ambulatory Visit (INDEPENDENT_AMBULATORY_CARE_PROVIDER_SITE_OTHER): Payer: 59 | Admitting: Family

## 2019-08-06 VITALS — BP 120/70 | HR 76 | Ht 63.0 in | Wt 177.4 lb

## 2019-08-06 DIAGNOSIS — F840 Autistic disorder: Secondary | ICD-10-CM | POA: Diagnosis not present

## 2019-08-06 DIAGNOSIS — Z79899 Other long term (current) drug therapy: Secondary | ICD-10-CM

## 2019-08-06 DIAGNOSIS — G43009 Migraine without aura, not intractable, without status migrainosus: Secondary | ICD-10-CM | POA: Diagnosis not present

## 2019-08-06 DIAGNOSIS — G40209 Localization-related (focal) (partial) symptomatic epilepsy and epileptic syndromes with complex partial seizures, not intractable, without status epilepticus: Secondary | ICD-10-CM

## 2019-08-06 DIAGNOSIS — F411 Generalized anxiety disorder: Secondary | ICD-10-CM

## 2019-08-06 DIAGNOSIS — Z832 Family history of diseases of the blood and blood-forming organs and certain disorders involving the immune mechanism: Secondary | ICD-10-CM

## 2019-08-06 DIAGNOSIS — Z87898 Personal history of other specified conditions: Secondary | ICD-10-CM

## 2019-08-06 DIAGNOSIS — G40309 Generalized idiopathic epilepsy and epileptic syndromes, not intractable, without status epilepticus: Secondary | ICD-10-CM

## 2019-08-06 DIAGNOSIS — Z8261 Family history of arthritis: Secondary | ICD-10-CM

## 2019-08-06 NOTE — Patient Instructions (Signed)
Thank you for coming in today.   Instructions for you until your next appointment are as follows: 1. Continue your medications as you have been taking them 2. Let me know if you have any seizures 3. The skin condition with the hives may be dermatographia - it is common in patients with allergies and with emotional upset. The hives are usually temporary and harmless.  4. I have ordered blood tests to check blood counts, liver and kidney, blood sugar, ANA and Sedimentation Rate. I will call you when I receive the results.  5. Please plan to return for follow up in 6 months or sooner if needed.

## 2019-08-06 NOTE — Progress Notes (Signed)
[name]   MRN:  [id number]  1991/01/01   Provider: [name] NP-C Location of Care: [facility] Child Neurology  Visit type: Routine visit  Last visit: 12/01/2018  Referral source: Warrick Parisian, MD History from: mother, patient, and chcn chart  Brief history:  Copied from previous record: History of autism, seizures and migraine headaches. She is taking and tolerating Levetiracetam and Topiramate for her seizure disorder and has remained seizure free since October 2011. She has occasional episodes in which she becomes pale and goes to her bed to lie down. These are thought to be migraine because her condition improves after a nap. These occur about once per month. She has had problems with mood and behavior, with screaming and being difficult to console. Being involved in a day program and staying on a regular schedule with daily outings has helped with this in the past.  Today's concerns:  Mom reports that [name] has gained weight since her last visit. She says that her appetite is variable and that sometimes she refuses dinner. Mom notes that [name] prefers to be fairly sedentary and will lie on the sofa or her bed all day if allowed to do so. She sometimes gets agitated and Mom believes that [name] is bored when this occurs. She has tried various activities and handicrafts to no avail. [name] enjoys going to a local store and walking around once a day.   Mom is also concerned that [name] may have undiagnosed autoimmune disorder such as lupus because there are several members of the family with this problem. Mom worries about the presence of rheumatoid arthritis because she has this disorder herself and wonders if [name]'s apparent fatigue is related to pain that she is unable to verbalize.   Finally Mom is worried about intermittent bouts of skin eruptions that she describes as welts or hives. She says that these skin rashes are intermittent tend to last less than  30 minutes at at time. She has also noted that [name] develops redness from persons or objects that touch or scratch her skin, such as when she shaves her legs.  [name] has been otherwise generally healthy since she was last seen. She has remained seizure free and Mom has no other health concerns for her today other than previously mentioned.   Review of systems: Please see HPI for neurologic and other pertinent review of systems. Otherwise all other systems were reviewed and were negative.  Problem List: Patient Active Problem List   Diagnosis Date Noted  . History of weight gain 11/22/2014  . 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 Anxiety state 11/04/2013  . 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 Partial epilepsy with impairment of consciousness (HCC) 11/05/2012  . Active autistic disorder 11/05/2012  . Migraine without aura 11/05/2012  . Autism spectrum disorder 11/05/2012     Past Medical History:  Diagnosis Date  . Autism   . Migraines   . Seizures (HCC)     Past medical history comments: See HPI Copied from previous record: Diagnosis of autism was confirmed when she was 29 years of age at the [facility] at the [facility] in [place]. She was diagnosed with undifferentiated autism. She had a normal EEG and CT scan. I do not know she had chromosomal evaluations.  The patient has been tried with a variety of treatments including vitamin B12, DMG, herbal therapy, angulating therapy as well as other vitamins.  These have been of marginal benefit to this patient.  She has a history of anxiety and migraines.  School tested [name] and found that she has an IQ in the range of 50 and undifferentiated autism. They also noted a mixed receptive and expressive language disorder.   Status epilepticus event May 2006  Status epilepticus event 02/02/2010 that occurred at school.  Surgical  history: Past Surgical History:  Procedure Laterality Date  . MOUTH SURGERY  Summer 2009  . WISDOM TOOTH EXTRACTION       Family history: family history includes Autoimmune disease in her maternal grandmother and mother; Cancer in her paternal grandmother; Lupus in her mother; Rheumatologic disease in her mother.   Social history: Social History   Socioeconomic History  . Marital status: Single    Spouse name: Not on file  . Number of children: Not on file  . Years of education: Not on file  . Highest education level: Not on file  Occupational History  . Not on file  Tobacco Use  . Smoking status: Never Smoker  . Smokeless tobacco: Never Used  Substance and Sexual Activity  . Alcohol use: No  . Drug use: No  . Sexual activity: Never  Other Topics Concern  . Not on file  Social History Narrative   She graduated from [employer] with a certificate in June 2015. She lives at home with her parents and 2 of her 5 siblings. She enjoys shopping, bowling, going to the park for walks, and watching videos.   Kinnley will be attending [employer]. 3 d a week   Social Determinants of Health   Financial Resource Strain:   . Difficulty of Paying Living Expenses:   Food Insecurity:   . Worried About Programme researcher, broadcasting/film/video in the Last Year:   . Barista in the Last Year:   Transportation Needs:   . Freight forwarder (Medical):   Marland Kitchen Lack of Transportation (Non-Medical):   Physical Activity:   . Days of Exercise per Week:   . Minutes of Exercise per Session:   Stress:   . Feeling of Stress :   Social Connections:   . Frequency of Communication with Friends and Family:   . Frequency of Social Gatherings with Friends and Family:   . Attends Religious Services:   . Active Member of Clubs or Organizations:   . Attends Banker Meetings:   Marland Kitchen Marital Status:   Intimate Partner Violence:   . Fear of Current or Ex-Partner:   . Emotionally  Abused:   Marland Kitchen Physically Abused:   . Sexually Abused:     Past/failed meds:   Allergies: No Known Allergies   Immunizations:  There is no immunization history on file for this patient.   Diagnostics/Screenings: rEEG - 10/21/2003 - normal awake. W.Hickling, MD  rEEG - 02/06/2010 - normal awake - W.Hickling, MD   Physical Exam: BP 120/70   Pulse 76   Ht 5\' 3"  (1.6 m)   Wt 177 lb 6.4 oz (80.5 kg)   BMI 31.42 kg/m   General: Well developed, well nourished young man, lying on exam table, in no evident distress, brown hair, brown eyes, right handed Head: Head normocephalic and atraumatic.  Oropharynx examination limited due to inability to cooperate but appears benign. Neck: Supple with no carotid bruits Cardiovascular: Regular rate and rhythm, no murmurs Respiratory: Breath sounds clear to auscultation Musculoskeletal: No obvious deformities or scoliosis Skin: No rashes or neurocutaneous  lesions  Neurologic Exam Mental Status: Awake and fully alert.  Language is very limited. Occasionally says "yes" or "no" but otherwise does not speak. Restless at times but is easily consoled by her mother. Able to follow a few very simple directions. Some resistance to invasions into her space.  Cranial Nerves: Fundoscopic exam reveals red reflex.  Pupils equal, briskly reactive to light.  Extraocular movements Hooper without nystagmus. Hearing intact and symmetric to mother's whisper.  Facial sensation appears intact.  Face and tongue move normally and symmetrically.  Neck flexion and extension normal. Motor: Normal functional bulk, tone and strength Sensory: Intact to touch and temperature in all extremities.  Coordination: Unable to adequately assess due to her inability to participate in examination. No tremor when reaching for objects.  Gait and Station: Arises from chair without difficulty.  Stance is normal. Gait demonstrates normal stride length and balance.  Unable to follow directions to  perform heel, toe and tandem walk. Balance is adequate. Reflexes: Unable to adequately assess due to her inability to participate in examination.  Impression: 1. Generalized convulsive epilepsy 2. 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 Autism spectrum disorder 4. Headaches, likely migraine 5. Anxiety 6. Family history of lupus and of rheumatoid arthritis  Recommendations for plan of care: The patient's previous [facility] records were reviewed. Evaluna has neither had nor required imaging or lab studies since the last visit. She is a 29 year old woman with history of autism, epilepsy, headaches, anxiety and family history of lupus and rheumatoid arthritis. She has remained seizure free on Topiramate and Levetiracetam. I talked with Mom about Jamella's sedentary behavior and encouraged Mom to continue to get Shanina out to walk when she is able to do so. We talked about the skin eruptions and I explained to Mom that these events may be idiopathic urticaria and/or dermatographia. Both conditions are typically exacerbated by anxiety. [name] is taking Fluoxetine for anxiety and Mom feels that it has been beneficial at the current dose so I did not recommend changes at this time.  I will order lab studies to evaluate for possible autoimmune disorders and will call Mom when the results are available to me. I will otherwise see [name] back in follow up in 6 months or sooner if needed. Mom agreed with the plans made today.   The medication list was reviewed and reconciled. No changes were made in the prescribed medications today. A complete medication list was provided to the patient.  Allergies as of 08/06/2019   No Known Allergies     Medication List       Accurate as of August 06, 2019 11:59 PM. If you have any questions, ask your nurse or doctor.        ALPRAZolam 0.25 MG tablet Commonly known as: XANAX Take 1 tablet per day as needed for anxiety   diazepam 5 MG tablet Commonly  known as: VALIUM Reported on 06/01/2015   FLUoxetine 20 MG tablet Commonly known as: PROZAC Give 1 tablet per day What changed: Another medication with the same name was removed. Continue taking this medication, and follow the directions you see here. Changed by: Rockwell Germany, NP   levETIRAcetam 250 MG tablet Commonly known as: KEPPRA Take 2 tablets (500 mg total) by mouth 2 (two) times daily.   multivitamin with minerals Tabs tablet Take 1 tablet by mouth daily.   pyridOXINE 100 MG tablet Commonly known as: VITAMIN B-6 Take 1 tablet twice per day with food.  topiramate 100 MG tablet Commonly known as: TOPAMAX TAKE 2 TABLETS (200 MG TOTAL) BY MOUTH 2 TIMES DAILY.       Total time spent with the patient was 20 minutes, of which 50% or more was spent in counseling and coordination of care.  [name] NP-C [facility]rology Ph. [phone] Fax [phone]

## 2019-08-09 LAB — COMPREHENSIVE METABOLIC PANEL
AG Ratio: 1.8 (calc) (ref 1.0–2.5)
ALT: 8 U/L (ref 6–29)
AST: 13 U/L (ref 10–30)
Albumin: 4.1 g/dL (ref 3.6–5.1)
Alkaline phosphatase (APISO): 57 U/L (ref 31–125)
BUN: 10 mg/dL (ref 7–25)
CO2: 23 mmol/L (ref 20–32)
Calcium: 8.9 mg/dL (ref 8.6–10.2)
Chloride: 109 mmol/L (ref 98–110)
Creat: 0.9 mg/dL (ref 0.50–1.10)
Globulin: 2.3 g/dL (calc) (ref 1.9–3.7)
Glucose, Bld: 62 mg/dL — ABNORMAL LOW (ref 65–99)
Potassium: 3.8 mmol/L (ref 3.5–5.3)
Sodium: 139 mmol/L (ref 135–146)
Total Bilirubin: 0.4 mg/dL (ref 0.2–1.2)
Total Protein: 6.4 g/dL (ref 6.1–8.1)

## 2019-08-09 LAB — SEDIMENTATION RATE: Sed Rate: 2 mm/h (ref 0–20)

## 2019-08-09 LAB — CBC WITH DIFFERENTIAL/PLATELET
Absolute Monocytes: 575 cells/uL (ref 200–950)
Basophils Absolute: 28 cells/uL (ref 0–200)
Basophils Relative: 0.4 %
Eosinophils Absolute: 170 cells/uL (ref 15–500)
Eosinophils Relative: 2.4 %
HCT: 42.1 % (ref 35.0–45.0)
Hemoglobin: 13.6 g/dL (ref 11.7–15.5)
Lymphs Abs: 1264 cells/uL (ref 850–3900)
MCH: 29.8 pg (ref 27.0–33.0)
MCHC: 32.3 g/dL (ref 32.0–36.0)
MCV: 92.3 fL (ref 80.0–100.0)
MPV: 12.3 fL (ref 7.5–12.5)
Monocytes Relative: 8.1 %
Neutro Abs: 5062 cells/uL (ref 1500–7800)
Neutrophils Relative %: 71.3 %
Platelets: 233 10*3/uL (ref 140–400)
RBC: 4.56 10*6/uL (ref 3.80–5.10)
RDW: 12.6 % (ref 11.0–15.0)
Total Lymphocyte: 17.8 %
WBC: 7.1 10*3/uL (ref 3.8–10.8)

## 2019-08-09 LAB — HEMOGLOBIN A1C
Hgb A1c MFr Bld: 5.2 % of total Hgb (ref <5.7)
Mean Plasma Glucose: 103 (calc)
eAG (mmol/L): 5.7 (calc)

## 2019-08-09 LAB — ANTI-NUCLEAR AB-TITER (ANA TITER): ANA Titer 1: 1:320 {titer} — ABNORMAL HIGH

## 2019-08-09 LAB — ANA: Anti Nuclear Antibody (ANA): POSITIVE — AB

## 2019-08-10 ENCOUNTER — Encounter (INDEPENDENT_AMBULATORY_CARE_PROVIDER_SITE_OTHER): Payer: Self-pay | Admitting: Family

## 2019-08-10 MED ORDER — TOPIRAMATE 100 MG PO TABS: ORAL_TABLET | ORAL | 5 refills | Status: DC

## 2019-08-10 MED ORDER — LEVETIRACETAM 250 MG PO TABS: 500.0000 mg | ORAL_TABLET | Freq: Two times a day (BID) | ORAL | 5 refills | Status: DC

## 2019-08-12 ENCOUNTER — Telehealth (INDEPENDENT_AMBULATORY_CARE_PROVIDER_SITE_OTHER): Payer: Self-pay | Admitting: Family

## 2019-08-12 DIAGNOSIS — G40209 Localization-related (focal) (partial) symptomatic epilepsy and epileptic syndromes with complex partial seizures, not intractable, without status epilepticus: Secondary | ICD-10-CM

## 2019-08-12 DIAGNOSIS — R768 Other specified abnormal immunological findings in serum: Secondary | ICD-10-CM

## 2019-08-12 DIAGNOSIS — G40309 Generalized idiopathic epilepsy and epileptic syndromes, not intractable, without status epilepticus: Secondary | ICD-10-CM

## 2019-08-12 DIAGNOSIS — F840 Autistic disorder: Secondary | ICD-10-CM

## 2019-08-12 NOTE — Telephone Encounter (Signed)
I left a message for Mom requesting call back to discuss [name]'s recent lab results. TG

## 2019-08-13 NOTE — Telephone Encounter (Signed)
I called Mom and reviewed Lin's recent lab results. The ANA titer was elevated and I will refer her to Rheumatology. I also talked with Mom about the blood glucose being 62 and recommended giving Taimi small snacks between meals. Mom agreed with this plan. TG

## 2019-10-13 NOTE — Progress Notes (Signed)
Office Visit Note  Patient: [name]             Date of Birth: October 03, 1990           MRN: [id number]             PCP: Curly Rim, MD Referring: Rockwell Germany, NP Visit Date: 10/14/2019 Occupation: '@GUAROCC' @  Subjective:  Positive ANA and Raynaud's.   History of Present Illness: [name] is a 29 y.o. female seen in consultation per request of her PCP.  Patient is accompanied by her mother who is the historian today.  According to her mother she is to go for regular walks with her about three fourths of a mile on a daily basis.  Since October 2020 she has been having progressive shortness of breath when she walks.  Her mother felt that it may be because of the weight gain and they have not walked in the last 1 month.  She is also having difficulty wiping herself for a short duration but then the symptoms resolved.  Her mother has also noticed that her hands and feet will get cold and white but do not turn blue.  She is also noticed a rash on her face.  With the family history of rheumatoid arthritis she got concerned and asked her PCP to do some lab work which came positive for ANA.  Further testing showed ENA was negative and sed rate was normal.  According to her mother she gets rashes easily.  She has not had any further work-up for shortness of breath.  Activities of Daily Living:  Patient reports morning stiffness for  0 minutes.   Patient Reports nocturnal pain.  Difficulty dressing/grooming: Denies Difficulty climbing stairs: Denies Difficulty getting out of chair: Denies Difficulty using hands for taps, buttons, cutlery, and/or writing: Reports  Review of Systems  Constitutional: Negative for fatigue, night sweats, weight gain and weight loss.  HENT: Positive for mouth dryness. Negative for mouth sores, trouble swallowing, trouble swallowing and nose dryness.   Eyes: Negative for pain, redness, itching, visual disturbance and dryness.  Respiratory:  Positive for shortness of breath. Negative for cough and difficulty breathing.   Cardiovascular: Negative for chest pain, palpitations, hypertension, irregular heartbeat and swelling in legs/feet.  Gastrointestinal: Positive for diarrhea. Negative for blood in stool and constipation.  Endocrine: Negative for increased urination.  Genitourinary: Negative for difficulty urinating and vaginal dryness.  Musculoskeletal: Positive for arthralgias and joint pain. Negative for joint swelling, myalgias, muscle weakness, morning stiffness, muscle tenderness and myalgias.  Skin: Positive for color change, rash and sensitivity to sunlight. Negative for hair loss, skin tightness and ulcers.  Allergic/Immunologic: Negative for susceptible to infections.  Neurological: Positive for headaches. Negative for dizziness, memory loss, night sweats and weakness.  Hematological: Positive for bruising/bleeding tendency. Negative for swollen glands.  Psychiatric/Behavioral: Positive for sleep disturbance. Negative for depressed mood. The patient is not nervous/anxious.     PMFS History:  Patient Active Problem List   Diagnosis Date Noted  . History of weight gain 11/22/2014  . 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 Anxiety state 11/04/2013  . 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 Partial epilepsy with impairment of consciousness ([place]) 11/05/2012  . Active autistic disorder 11/05/2012  . Migraine without aura 11/05/2012  . Autism spectrum disorder 11/05/2012    Past Medical History:  Diagnosis Date  . Autism   . Migraines   .  Seizures ([place])     Family History  Problem Relation Age of Onset  . Autoimmune disease Maternal Grandmother        Died at 65, systemic scleroderma  . Autoimmune disease Mother   . Lupus Mother   . Rheumatologic disease Mother   . Healthy Father   . Healthy Sister   . Healthy Brother   . Cancer Paternal  Grandmother        Died in her 94's  . Healthy Brother   . Healthy Brother   . Healthy Brother   . Lupus Maternal Aunt    Past Surgical History:  Procedure Laterality Date  . MOUTH SURGERY  Summer 2009  . TOE SURGERY Right    toenail   . WISDOM TOOTH EXTRACTION     Social History   Social History Narrative   She graduated from [employer] with a certificate in June 3329. She lives at home with her parents and 2 of her 5 siblings. She enjoys shopping, bowling, going to the park for walks, and watching videos.   [name] will be attending [employer]. 3 d a week    There is no immunization history on file for this patient.   Objective: Vital Signs: BP 101/70 (BP Location: Right Arm, Patient Position: Sitting, Cuff Size: Normal)   Pulse 81   Resp 16   Ht '5\' 4"'  (1.626 m)   Wt 180 lb 12.8 oz (82 kg)   BMI 31.03 kg/m    Physical Exam Vitals and nursing note (Nonverbal) reviewed.  Constitutional:      Appearance: She is well-developed.  HENT:     Head: Normocephalic and atraumatic.  Eyes:     Conjunctiva/sclera: Conjunctivae normal.  Cardiovascular:     Rate and Rhythm: Normal rate and regular rhythm.     Heart sounds: Normal heart sounds.  Pulmonary:     Effort: Pulmonary effort is normal.     Breath sounds: Normal breath sounds.  Abdominal:     General: Bowel sounds are normal.     Palpations: Abdomen is soft.  Musculoskeletal:     Cervical back: Normal range of motion.  Lymphadenopathy:     Cervical: No cervical adenopathy.  Skin:    General: Skin is warm and dry.     Capillary Refill: Capillary refill takes less than 2 seconds.  Neurological:     Mental Status: She is alert and oriented to person, place, and time.  Psychiatric:        Behavior: Behavior normal.      Musculoskeletal Exam: C-spine was in good range of motion.  Shoulder joints, elbow joints, wrist joints, MCPs PIPs and DIPs with good range of motion.  Hip joints, knee  joints, ankles, MTPs and PIPs with good range of motion with no synovitis.  She had good capillary refill.  No sclerodactyly was noted.  She has some hypermobility in her joints.  CDAI Exam: CDAI Score: -- Patient Global: --; Provider Global: -- Swollen: --; Tender: -- Joint Exam 10/14/2019   No joint exam has been documented for this visit   There is currently no information documented on the homunculus. Go to the Rheumatology activity and complete the homunculus joint exam.  Investigation: No additional findings.  Imaging: No results found.  Recent Labs: Lab Results  Component Value Date   WBC 7.1 08/06/2019   HGB 13.6 08/06/2019   PLT 233 08/06/2019   NA 139 08/06/2019   K 3.8 08/06/2019  CL 109 08/06/2019   CO2 23 08/06/2019   GLUCOSE 62 (L) 08/06/2019   BUN 10 08/06/2019   CREATININE 0.90 08/06/2019   BILITOT 0.4 08/06/2019   ALKPHOS 61 02/02/2010   AST 13 08/06/2019   ALT 8 08/06/2019   PROT 6.4 08/06/2019   ALBUMIN 3.9 02/02/2010   CALCIUM 8.9 08/06/2019   GFRAA  02/02/2010    >60        The eGFR has been calculated using the MDRD equation. This calculation has not been validated in all clinical situations. eGFR's persistently <60 mL/min signify possible Chronic Kidney Disease.  Aug 31, 2019 ENA negative, ESR 9, C-reactive protein 3, TSH normal, folate normal, B12 normal, ferritin normal, vitamin D 21.5 low  Speciality Comments: No specialty comments available.  Procedures:  No procedures performed Allergies: Patient has no known allergies.   Assessment / Plan:     Visit Diagnoses: Positive ANA (antinuclear antibody) - ANA 1:320 NS, ESR 2, ENA negative, ESR and C-reactive protein normal.  There is strong family history of autoimmune disease and some family members with lupus.  It is not unusual to see positive ANA in patients with positive family history of autoimmune disease.  Her mother is concerned as she has noticed some rash on her face and  extremities and also possible Raynaud's in her hands.  She is also concerned that she has been having some shortness of breath on exertion.  She has not had any further work-up.  All other autoimmune work-up has been negative.  I would recommend following labs which should include ANA titer, C3-C4, beta-2, anticardiolipin, lupus anticoagulant, cryoglobulins, pan-ANCA, rheumatoid factor and anti-CCP.  There has been one case report on positive ANA with use of Keppra.  Raynaud's disease without gangrene-she had good capillary refill.  No nailbed capillary changes were noted.  No sclerodactyly was noted.  Shortness of breath-patient has been experiencing shortness of breath on exertion.  She has not had any work-up.  I have advised her to schedule an appointment with her PCP for evaluation.  Family history of rheumatoid arthritis - Mother  Family history of scleroderma - Maternal grandmother  Family history of lupus erythematosus - Maternal aunt and several of mother's cousins.  Active autistic disorder-patient is nonverbal.  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  Migraine without aura and without status migrainosus, not intractable-she is on Topamax.   Orders: No orders of the defined types were placed in this encounter.  No orders of the defined types were placed in this encounter.   Face-to-face time spent with patient was 45 minutes. Greater than 50% of time was spent in counseling and coordination of care.  Follow-Up Instructions: Return for Positive ANA, Raynaud's.   Bo Merino, MD  Note - This record has been created using Editor, commissioning.  Chart creation errors have been sought, but may not always  have been located. Such creation errors do not reflect on  the standard of medical care.

## 2019-10-14 ENCOUNTER — Ambulatory Visit (INDEPENDENT_AMBULATORY_CARE_PROVIDER_SITE_OTHER): Payer: 59 | Admitting: Rheumatology

## 2019-10-14 ENCOUNTER — Other Ambulatory Visit: Payer: Self-pay

## 2019-10-14 ENCOUNTER — Encounter: Payer: Self-pay | Admitting: Rheumatology

## 2019-10-14 VITALS — BP 101/70 | HR 81 | Resp 16 | Ht 64.0 in | Wt 180.8 lb

## 2019-10-14 DIAGNOSIS — R768 Other specified abnormal immunological findings in serum: Secondary | ICD-10-CM

## 2019-10-14 DIAGNOSIS — G40309 Generalized idiopathic epilepsy and epileptic syndromes, not intractable, without status epilepticus: Secondary | ICD-10-CM

## 2019-10-14 DIAGNOSIS — R0602 Shortness of breath: Secondary | ICD-10-CM

## 2019-10-14 DIAGNOSIS — F419 Anxiety disorder, unspecified: Secondary | ICD-10-CM

## 2019-10-14 DIAGNOSIS — G40209 Localization-related (focal) (partial) symptomatic epilepsy and epileptic syndromes with complex partial seizures, not intractable, without status epilepticus: Secondary | ICD-10-CM

## 2019-10-14 DIAGNOSIS — Z8261 Family history of arthritis: Secondary | ICD-10-CM | POA: Diagnosis not present

## 2019-10-14 DIAGNOSIS — Z8269 Family history of other diseases of the musculoskeletal system and connective tissue: Secondary | ICD-10-CM

## 2019-10-14 DIAGNOSIS — Z840 Family history of diseases of the skin and subcutaneous tissue: Secondary | ICD-10-CM

## 2019-10-14 DIAGNOSIS — G43009 Migraine without aura, not intractable, without status migrainosus: Secondary | ICD-10-CM

## 2019-10-14 DIAGNOSIS — I7300 Raynaud's syndrome without gangrene: Secondary | ICD-10-CM | POA: Diagnosis not present

## 2019-10-14 DIAGNOSIS — F5105 Insomnia due to other mental disorder: Secondary | ICD-10-CM

## 2019-10-14 DIAGNOSIS — F840 Autistic disorder: Secondary | ICD-10-CM

## 2019-10-22 NOTE — [place] Notes (Signed)
I will discuss results at the follow-up visit.

## 2019-11-03 NOTE — Progress Notes (Signed)
Office Visit Note  Patient: [name]             Date of Birth: 1990/07/23           MRN: [id number]             PCP: Curly Rim, MD Referring: Curly Rim, MD Visit Date: 11/17/2019 Occupation: _0 @  Subjective:  Cold hands and feet.   History of Present Illness: [name] is a 29 y.o. female with history of positive ANA and Raynaud's.  She was accompanied by her mother today.  According to her mother she continues to have cold hands.  She has not seen any discoloration in her hands.  She denies any joint pain or joint swelling.  She has been active and doing routine activities.  She was evaluated by her PCP for shortness of breath and the work-up was negative.  She is also wanting to see a pulmonologist.  She believes the weight gain could be contributing to her shortness of breath.  She has gained weight during the last year.  There is no history of oral ulcers, nasal ulcers, malar rash, photosensitivity, lymphadenopathy or joint swelling.  Activities of Daily Living:  Patient reports morning stiffness for 0 minutes.   Patient Denies nocturnal pain.  Difficulty dressing/grooming: Denies Difficulty climbing stairs: Denies Difficulty getting out of chair: Denies Difficulty using hands for taps, buttons, cutlery, and/or writing: Denies  Review of Systems  Constitutional: Positive for fatigue. Negative for night sweats, weight gain and weight loss.  HENT: Negative for mouth sores, trouble swallowing, trouble swallowing, mouth dryness and nose dryness.   Eyes: Negative for pain, redness, visual disturbance and dryness.  Respiratory: Positive for shortness of breath. Negative for cough and difficulty breathing.   Cardiovascular: Negative for chest pain, palpitations, hypertension, irregular heartbeat and swelling in legs/feet.  Gastrointestinal: Negative for blood in stool, constipation and diarrhea.  Endocrine: Negative for increased urination.    Genitourinary: Negative for difficulty urinating and vaginal dryness.  Musculoskeletal: Negative for arthralgias, joint pain, joint swelling, myalgias, muscle weakness, morning stiffness, muscle tenderness and myalgias.  Skin: Negative for color change, rash, hair loss, redness, skin tightness, ulcers and sensitivity to sunlight.  Allergic/Immunologic: Negative for susceptible to infections.  Neurological: Positive for headaches. Negative for dizziness, memory loss, night sweats and weakness.  Hematological: Negative for bruising/bleeding tendency and swollen glands.  Psychiatric/Behavioral: Positive for sleep disturbance. Negative for depressed mood. The patient is not nervous/anxious.     PMFS History:  Patient Active Problem List   Diagnosis Date Noted  . History of weight gain 11/22/2014  . 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 Anxiety state 11/04/2013  . 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 Partial epilepsy with impairment of consciousness ([place]) 11/05/2012  . Active autistic disorder 11/05/2012  . Migraine without aura 11/05/2012  . Autism spectrum disorder 11/05/2012    Past Medical History:  Diagnosis Date  . Autism   . Migraines   . Seizures ([place])     Family History  Problem Relation Age of Onset  . Autoimmune disease Maternal Grandmother        Died at 64, systemic scleroderma  . Autoimmune disease Mother   . Lupus Mother   . Rheumatologic disease Mother   . Healthy Father   . Healthy Sister   . Healthy Brother   . Cancer Paternal Grandmother        Died  in her 23's  . Healthy Brother   . Healthy Brother   . Healthy Brother   . Lupus Maternal Aunt    Past Surgical History:  Procedure Laterality Date  . MOUTH SURGERY  Summer 2009  . TOE SURGERY Right    toenail   . WISDOM TOOTH EXTRACTION     Social History   Social History Narrative   She graduated from [employer]  with a certificate in June 1610. She lives at home with her parents and 2 of her 5 siblings. She enjoys shopping, bowling, going to the park for walks, and watching videos.   Lataja will be attending [employer]. 3 d a week    There is no immunization history on file for this patient.   Objective: Vital Signs: BP 94/71 (BP Location: Left Arm, Patient Position: Sitting, Cuff Size: Normal)   Pulse 69   Resp 16   Ht 5' 4" (1.626 m)   Wt 176 lb 3.2 oz (79.9 kg)   BMI 30.24 kg/m    Physical Exam Vitals and nursing note reviewed.  Constitutional:      Appearance: She is well-developed.  HENT:     Head: Normocephalic and atraumatic.  Eyes:     Conjunctiva/sclera: Conjunctivae normal.  Cardiovascular:     Rate and Rhythm: Normal rate and regular rhythm.     Heart sounds: Normal heart sounds.  Pulmonary:     Effort: Pulmonary effort is normal.     Breath sounds: Normal breath sounds.  Abdominal:     General: Bowel sounds are normal.     Palpations: Abdomen is soft.  Musculoskeletal:     Cervical back: Normal range of motion.  Lymphadenopathy:     Cervical: No cervical adenopathy.  Skin:    General: Skin is warm and dry.     Capillary Refill: Capillary refill takes less than 2 seconds.  Neurological:     Mental Status: She is alert and oriented to person, place, and time.  Psychiatric:        Behavior: Behavior normal.      Musculoskeletal Exam: C-spine was in good range of motion.  Shoulder joints, elbow joints, wrist joints, MCPs and PIPs with good range of motion.  Hip joints, knee joints, ankles no MTPs and PIPs with good range of motion.  She has hypermobility in her joints and bilateral pes planus.  CDAI Exam: CDAI Score: -- Patient Global: --; Provider Global: -- Swollen: --; Tender: -- Joint Exam 11/17/2019   No joint exam has been documented for this visit   There is currently no information documented on the homunculus. Go to the Rheumatology activity and  complete the homunculus joint exam.  Investigation: No additional findings.  Imaging: No results found.  Recent Labs: Lab Results  Component Value Date   WBC 7.1 08/06/2019   HGB 13.6 08/06/2019   PLT 233 08/06/2019   NA 139 08/06/2019   K 3.8 08/06/2019   CL 109 08/06/2019   CO2 23 08/06/2019   GLUCOSE 62 (L) 08/06/2019   BUN 10 08/06/2019   CREATININE 0.90 08/06/2019   BILITOT 0.4 08/06/2019   ALKPHOS 61 02/02/2010   AST 13 08/06/2019   ALT 8 08/06/2019   PROT 6.4 08/06/2019   ALBUMIN 3.9 02/02/2010   CALCIUM 8.9 08/06/2019   GFRAA  02/02/2010    >60        The eGFR has been calculated using the MDRD equation. This calculation has not been  validated in all clinical situations. eGFR's persistently <60 mL/min signify possible Chronic Kidney Disease.  October 21, 2019.  ANCA negative, lupus anticoagulant negative, anticardiolipin negative, beta-2 GP 1 -, C3-C4 normal, RF negative, anti-CCP negative, cryoglobulins negative  Aug 31, 2019 ENA negative, ESR 9, C-reactive protein 3, TSH normal, folate normal, B12 normal, ferritin normal, vitamin D 21.5 low  August 06, 2019 ANA 1: 320NS  Speciality Comments: No specialty comments available.  Procedures:  No procedures performed Allergies: Patient has no known allergies.   Assessment / Plan:     Visit Diagnoses: Positive ANA (antinuclear antibody)-she had positive ANA 1: 320 speckled.  ENA, complements and other antibodies were all negative.  She has no clinical features of autoimmune disease.  I have advised them to contact me in case she develops any new symptoms.  It is not unusual to see positive ANA will have family members with autoimmune disease.  Raynaud's disease without gangrene-her mother gives history of Raynaud's phenomenon.  She had good capillary refill and no nailbed capillary changes.  Keeping core temperature warm was discussed.  Pes planus of both feet-use of orthotics was emphasized.  Shortness of  breath-her lungs were clear to auscultation.  She has been experiencing some shortness of breath on exertion.  She had work-up by her PCP.  Her mother is planning to take her to a pulmonologist.  She believes the weight gain must be contributing to her shortness of breath on exertion.  Other medical problems are listed as follows:  Family history of rheumatoid arthritis  Active autistic disorder  Insomnia secondary to anxiety  Partial epilepsy with impairment of consciousness (HCC)  Generalized convulsive epilepsy ([place])  Migraine without aura and without status migrainosus, not intractable  Family history of scleroderma  Family history of lupus erythematosus  Orders: No orders of the defined types were placed in this encounter.  No orders of the defined types were placed in this encounter.     Follow-Up Instructions: Return if symptoms worsen or fail to improve, for +ANA,Raynauds.   Bo Merino, MD  Note - This record has been created using Editor, commissioning.  Chart creation errors have been sought, but may not always  have been located. Such creation errors do not reflect on  the standard of medical care.

## 2019-11-04 LAB — RHEUMATOID FACTOR: Rheumatoid fact SerPl-aCnc: <10 IU/mL (ref 0.0–13.9)

## 2019-11-04 LAB — CYCLIC CITRUL PEPTIDE ANTIBODY, IGG/IGA: Cyclic Citrullin Peptide Ab: 4 units (ref 0–19)

## 2019-11-04 LAB — C3 AND C4
Complement C3, Serum: 134 mg/dL (ref 82–167)
Complement C4, Serum: 28 mg/dL (ref 12–38)

## 2019-11-04 LAB — PAN-ANCA
ANCA Proteinase 3: <3.5 U/mL (ref 0.0–3.5)
Atypical pANCA: <1:20 {titer}
C-ANCA: <1:20 {titer}
Myeloperoxidase Ab: <9 U/mL (ref 0.0–9.0)
P-ANCA: <1:20 {titer}

## 2019-11-04 LAB — BETA-2 GLYCOPROTEIN I AB, IGA: Beta-2 Glyco 1 IgA: <9 GPI IgA units (ref 0–25)

## 2019-11-04 LAB — LUPUS ANTICOAGULANT PANEL
Dilute Viper Venom Time: 38.6 s (ref 0.0–47.0)
PTT Lupus Anticoagulant: 32.7 s (ref 0.0–51.9)

## 2019-11-04 LAB — CRYOGLOBULIN

## 2019-11-04 LAB — CARDIOLIPIN ANTIBODIES, IGG, IGM, IGA
Anticardiolipin IgA: <9 APL U/mL (ref 0–11)
Anticardiolipin IgG: <9 GPL U/mL (ref 0–14)
Anticardiolipin IgM: <9 MPL U/mL (ref 0–12)

## 2019-11-17 ENCOUNTER — Encounter: Payer: Self-pay | Admitting: Rheumatology

## 2019-11-17 ENCOUNTER — Other Ambulatory Visit: Payer: Self-pay

## 2019-11-17 ENCOUNTER — Ambulatory Visit (INDEPENDENT_AMBULATORY_CARE_PROVIDER_SITE_OTHER): Payer: 59 | Admitting: Rheumatology

## 2019-11-17 VITALS — BP 94/71 | HR 69 | Resp 16 | Ht 64.0 in | Wt 176.2 lb

## 2019-11-17 DIAGNOSIS — R0602 Shortness of breath: Secondary | ICD-10-CM

## 2019-11-17 DIAGNOSIS — G43009 Migraine without aura, not intractable, without status migrainosus: Secondary | ICD-10-CM

## 2019-11-17 DIAGNOSIS — M2141 Flat foot [pes planus] (acquired), right foot: Secondary | ICD-10-CM | POA: Diagnosis not present

## 2019-11-17 DIAGNOSIS — I7300 Raynaud's syndrome without gangrene: Secondary | ICD-10-CM | POA: Diagnosis not present

## 2019-11-17 DIAGNOSIS — F5105 Insomnia due to other mental disorder: Secondary | ICD-10-CM

## 2019-11-17 DIAGNOSIS — Z8269 Family history of other diseases of the musculoskeletal system and connective tissue: Secondary | ICD-10-CM

## 2019-11-17 DIAGNOSIS — Z8261 Family history of arthritis: Secondary | ICD-10-CM

## 2019-11-17 DIAGNOSIS — R768 Other specified abnormal immunological findings in serum: Secondary | ICD-10-CM

## 2019-11-17 DIAGNOSIS — G40309 Generalized idiopathic epilepsy and epileptic syndromes, not intractable, without status epilepticus: Secondary | ICD-10-CM

## 2019-11-17 DIAGNOSIS — F840 Autistic disorder: Secondary | ICD-10-CM

## 2019-11-17 DIAGNOSIS — F419 Anxiety disorder, unspecified: Secondary | ICD-10-CM

## 2019-11-17 DIAGNOSIS — M2142 Flat foot [pes planus] (acquired), left foot: Secondary | ICD-10-CM

## 2019-11-17 DIAGNOSIS — G40209 Localization-related (focal) (partial) symptomatic epilepsy and epileptic syndromes with complex partial seizures, not intractable, without status epilepticus: Secondary | ICD-10-CM

## 2019-11-17 DIAGNOSIS — Z840 Family history of diseases of the skin and subcutaneous tissue: Secondary | ICD-10-CM

## 2019-11-25 ENCOUNTER — Ambulatory Visit: Payer: Self-pay | Admitting: Rheumatology

## 2019-12-23 ENCOUNTER — Ambulatory Visit: Payer: Self-pay | Admitting: Rheumatology

## 2020-01-24 ENCOUNTER — Other Ambulatory Visit (INDEPENDENT_AMBULATORY_CARE_PROVIDER_SITE_OTHER): Payer: Self-pay | Admitting: Family

## 2020-01-24 DIAGNOSIS — F840 Autistic disorder: Secondary | ICD-10-CM

## 2020-01-24 DIAGNOSIS — F411 Generalized anxiety disorder: Secondary | ICD-10-CM

## 2020-02-08 ENCOUNTER — Other Ambulatory Visit (INDEPENDENT_AMBULATORY_CARE_PROVIDER_SITE_OTHER): Payer: Self-pay | Admitting: Family

## 2020-02-08 DIAGNOSIS — G40309 Generalized idiopathic epilepsy and epileptic syndromes, not intractable, without status epilepticus: Secondary | ICD-10-CM

## 2020-02-08 DIAGNOSIS — G40209 Localization-related (focal) (partial) symptomatic epilepsy and epileptic syndromes with complex partial seizures, not intractable, without status epilepticus: Secondary | ICD-10-CM

## 2020-02-25 ENCOUNTER — Encounter (INDEPENDENT_AMBULATORY_CARE_PROVIDER_SITE_OTHER): Payer: Self-pay | Admitting: Family

## 2020-02-25 ENCOUNTER — Ambulatory Visit (INDEPENDENT_AMBULATORY_CARE_PROVIDER_SITE_OTHER): Payer: 59 | Admitting: Family

## 2020-02-25 ENCOUNTER — Other Ambulatory Visit: Payer: Self-pay

## 2020-02-25 VITALS — BP 110/70 | HR 84 | Ht 63.0 in | Wt 179.8 lb

## 2020-02-25 DIAGNOSIS — G43009 Migraine without aura, not intractable, without status migrainosus: Secondary | ICD-10-CM

## 2020-02-25 DIAGNOSIS — G40209 Localization-related (focal) (partial) symptomatic epilepsy and epileptic syndromes with complex partial seizures, not intractable, without status epilepticus: Secondary | ICD-10-CM | POA: Diagnosis not present

## 2020-02-25 DIAGNOSIS — F840 Autistic disorder: Secondary | ICD-10-CM | POA: Diagnosis not present

## 2020-02-25 DIAGNOSIS — G40309 Generalized idiopathic epilepsy and epileptic syndromes, not intractable, without status epilepticus: Secondary | ICD-10-CM

## 2020-02-25 MED ORDER — TOPIRAMATE 100 MG PO TABS: ORAL_TABLET | ORAL | 5 refills | Status: DC

## 2020-02-25 MED ORDER — LEVETIRACETAM 250 MG PO TABS: 500.0000 mg | ORAL_TABLET | Freq: Two times a day (BID) | ORAL | 5 refills | Status: DC

## 2020-02-25 NOTE — Progress Notes (Signed)
[name]   MRN:  [id number]  [birth year]   Provider: [name] NP-C Location of Care: [facility] Child Neurology  Visit type: Routine type  Last visit: 08/06/2019  Referral source: Warrick Parisian, MD History from: mother, patient, and chnc chart  Brief history:  Copied from previous record: History of autism, seizures and migraine headaches. She is taking and tolerating Levetiracetam and Topiramate for her seizure disorder and has remained seizure free since October 2011. She has occasional episodes in which she becomes pale and goes to her bed to lie down. These are thought to be migraine because her condition improves after a nap. These occur about once per month. She has had problems with mood and behavior, with screaming and being difficult to console. Being involved in a day program and staying on a regular schedule with daily outings has helped with this in the past.  Today's concerns:  Mom reports today that [name] has remained seizure free since her last visit. Mom stopped the Fluoxetine in August because she did not feel that it was helpful, and says that [name]'s behavior is unchanged since stopping the medication. She will be working with a behavior specialist soon and is hopeful that they can find ways to avoid "meltdowns". Mom is interested in decreasing or stopping the Topamax dose to see if it will help with mood and behavior.   [name] has had episodes thought to be migraine in the past but Mom reports today that she has not had these behaviors in some time.   [name] was seen by rheumatology for positive ANA lab result and Mom notes that all tests there were normal. [name] has been otherwise generally healthy since she was last seen. Mom has no other health concerns for [name] today other than previously mentioned.  Review of systems: Please see HPI for neurologic and other pertinent review of systems. Otherwise all other systems were reviewed and were  negative.  Problem List: Patient Active Problem List   Diagnosis Date Noted  . History of weight gain 11/22/2014  . 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 Anxiety state 11/04/2013  . 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 Partial epilepsy with impairment of consciousness (HCC) 11/05/2012  . Active autistic disorder 11/05/2012  . Migraine without aura 11/05/2012  . Autism spectrum disorder 11/05/2012     Past Medical History:  Diagnosis Date  . Autism   . Migraines   . Seizures (HCC)     Past medical history comments: See HPI Copied from previous record: Diagnosis of autism was confirmed when she was 29 years of age at the [facility] the [facility] in [place]. She was diagnosed with undifferentiated autism. She had a normal EEG and CT scan. I do not know she had chromosomal evaluations.  The patient has been tried with a variety of treatments including vitamin B12, DMG, herbal therapy, angulating therapy as well as other vitamins. These have been of marginal benefit to this patient.  She has a history of anxiety and migraines.  School tested [name] and found that she has an IQ in the range of 50 and undifferentiated autism. They also noted a mixed receptive and expressive language disorder.  Status epilepticus event May 2006  Status epilepticus event 02/02/2010 that occurred at school.  Surgical history: Past Surgical History:  Procedure Laterality Date  . MOUTH SURGERY  Summer 2009  . TOE SURGERY Right  toenail   . WISDOM TOOTH EXTRACTION       Family history: family history includes Autoimmune disease in her maternal grandmother and mother; Cancer in her paternal grandmother; Healthy in her brother, brother, brother, brother, father, and sister; Lupus in her maternal aunt and mother; Rheumatologic disease in her mother.   Social history: Social History    Socioeconomic History  . Marital status: Single    Spouse name: Not on file  . Number of children: Not on file  . Years of education: Not on file  . Highest education level: Not on file  Occupational History  . Not on file  Tobacco Use  . Smoking status: Never Smoker  . Smokeless tobacco: Never Used  Vaping Use  . Vaping Use: Never used  Substance and Sexual Activity  . Alcohol use: No  . Drug use: No  . Sexual activity: Never  Other Topics Concern  . Not on file  Social History Narrative   She graduated from [employer] with a certificate in June 2015. She lives at home with her parents and 2 of her 5 siblings. She enjoys shopping, bowling, going to the park for walks, and watching videos.   [name] will be attending [employer]. 3 d a week   Social Determinants of Health   Financial Resource Strain:   . Difficulty of Paying Living Expenses: Not on file  Food Insecurity:   . Worried About Programme researcher, broadcasting/film/video in the Last Year: Not on file  . Ran Out of Food in the Last Year: Not on file  Transportation Needs:   . Lack of Transportation (Medical): Not on file  . Lack of Transportation (Non-Medical): Not on file  Physical Activity:   . Days of Exercise per Week: Not on file  . Minutes of Exercise per Session: Not on file  Stress:   . Feeling of Stress : Not on file  Social Connections:   . Frequency of Communication with Friends and Family: Not on file  . Frequency of Social Gatherings with Friends and Family: Not on file  . Attends Religious Services: Not on file  . Active Member of Clubs or Organizations: Not on file  . Attends Banker Meetings: Not on file  . Marital Status: Not on file  Intimate Partner Violence:   . Fear of Current or Ex-Partner: Not on file  . Emotionally Abused: Not on file  . Physically Abused: Not on file  . Sexually Abused: Not on file   Past/failed meds: Fluoxetine - not beneficial for mood and  behavior.  Allergies: No Known Allergies   Immunizations:  There is no immunization history on file for this patient.   Diagnostics/Screenings: Copied from previous chart: rEEG - 10/21/2003 - normal awake. W.Hickling, MD  rEEG - 02/06/2010 - normal awake - W.Hickling, MD  Physical Exam: BP 110/70   Pulse 84   Ht 5\' 3"  (1.6 m)   Wt 179 lb 12.8 oz (81.6 kg)   BMI 31.85 kg/m   General: well developed, well nourished girl, seated on exam table, in no evident distress; brown hair, brown eyes, right handed Head: normocephalic and atraumatic. Oropharynx limited by her inability to cooperate but appears benign. No dysmorphic features. Neck: supple Cardiovascular: regular rate and rhythm, no murmurs. Respiratory: clear to auscultation bilaterally Abdomen: bowel sounds present all four quadrants, abdomen soft, non-tender, non-distended. No hepatosplenomegaly or masses palpated. Musculoskeletal: no skeletal deformities or obvious scoliosis.  Skin: no rashes or  neurocutaneous lesions  Neurologic Exam Mental Status: awake and fully alert. Has very limited language. Will answer yes or no to some questions.  Smiles responsively. Tolerant of invasions into his space Cranial Nerves: fundoscopic exam - red reflex present.  Unable to fully visualize fundus.  Pupils equal briskly reactive to light.  Turns to localize faces and objects in the periphery. Turns to localize sounds in the periphery. Facial movements are symmetric. Motor: normal functional bulk, tone and strength Sensory: withdrawal x 4 Coordination: unable to adequately assess due to patient's inability to participate in examination. No dysmetria when reaching for objects. Gait and Station: normal stance and gait Reflexes: diminished and symmetric. Toes neutral. No clonus  Impression: 1. Generalized convulsive epilepsy 2. 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 Autism spectrum disorder 4. Headaches, likely  migraine 5. Anxiety 6. Family history of lupus and rheumatoid arthritis  Recommendations for plan of care: The patient's previous [facility] records were reviewed. Shanaia has neither had nor required imaging or lab studies since the last visit, other than what was performed by her rheumatologist. Mom is aware of those results. Lakesa was taking Fluoxetine for mood and behavior, but Mom stopped it because she felt that it was ineffective. Afsa has done well off the medication. Mom is interested in tapering Topamax and I talked with Mom about that today. I recommended performing an EEG to see if Skylor can safely taper off medication and Mom agreed with that plan. She will have an EEG performed at this office in November and I will call Mom with the results and recommendations for a treatment plan. Kerianna will continue on her medications without change for now. I will see otherwise see her back in follow up in 6 months or sooner if needed. Mom agreed with the plans made today.   The medication list was reviewed and reconciled. No changes were made in the prescribed medications today. A complete medication list was provided to the patient.  Allergies as of 02/25/2020   No Known Allergies     Medication List       Accurate as of February 25, 2020 11:59 PM. If you have any questions, ask your nurse or doctor.        STOP taking these medications   FLUoxetine 20 MG tablet Commonly known as: PROZAC Stopped by: [name], NP     TAKE these medications   diazepam 5 MG tablet Commonly known as: VALIUM Reported on 06/01/2015   levETIRAcetam 250 MG tablet Commonly known as: KEPPRA Take 2 tablets (500 mg total) by mouth 2 (two) times daily.   multivitamin with minerals Tabs tablet Take 1 tablet by mouth daily.   topiramate 100 MG tablet Commonly known as: TOPAMAX TAKE 2 TABLETS (200 MG TOTAL) BY MOUTH 2 TIMES DAILY.       Total time spent with the patient was 25 minutes, of which 50%  or more was spent in counseling and coordination of care.  [name] NP-C [facility] Ph. [phone] Fax [phone]

## 2020-02-25 NOTE — Patient Instructions (Signed)
Thank you for coming in today.   Instructions for you until your next appointment are as follows: 1. I have scheduled an EEG for [name] at this office on November 18 at 11AM. Be sure to bring her iPad or phone for her to use during the test.  2. I will call you when the EEG has been read - usually that day or the following day 3. Continue [name]'s medications as prescribed for now 4. Please sign up for MyChart if you have not done so 5. Please plan to return for follow up in 6 months or sooner if needed.

## 2020-02-27 ENCOUNTER — Encounter (INDEPENDENT_AMBULATORY_CARE_PROVIDER_SITE_OTHER): Payer: Self-pay | Admitting: Family

## 2020-03-17 ENCOUNTER — Other Ambulatory Visit: Payer: Self-pay

## 2020-03-17 ENCOUNTER — Ambulatory Visit (INDEPENDENT_AMBULATORY_CARE_PROVIDER_SITE_OTHER): Payer: 59 | Admitting: Pediatrics

## 2020-03-17 DIAGNOSIS — F840 Autistic disorder: Secondary | ICD-10-CM

## 2020-03-17 DIAGNOSIS — G40309 Generalized idiopathic epilepsy and epileptic syndromes, not intractable, without status epilepticus: Secondary | ICD-10-CM | POA: Diagnosis not present

## 2020-03-17 DIAGNOSIS — G40209 Localization-related (focal) (partial) symptomatic epilepsy and epileptic syndromes with complex partial seizures, not intractable, without status epilepticus: Secondary | ICD-10-CM

## 2020-03-17 NOTE — Progress Notes (Signed)
EEG Completed; Results Pending  

## 2020-03-29 NOTE — Progress Notes (Signed)
Patient: [name] MRN: [id number] Sex: female DOB: 1991/01/03  Clinical History: [name] is a 29 y.o. with history of autism, seizures, and migraines.  She has been seizure free since October 2011.  She has ocasional episodes where she becomes pale and goes to her bed to lie down.  Mother interested in stopping Topamax.    Medications: levetiracetam (Keppra) and topiramate (Topamax)  Procedure: The tracing is carried out on a 32-channel digital Natus recorder, reformatted into 16-channel montages with 1 devoted to EKG.  The patient was awake during the recording.  The international 10/20 system lead placement used.  Recording time 23 minutes.   Description of Findings: Background rhythm is composed of mixed amplitude and frequency with a posterior dominant rythym of  60 microvolt and frequency of 9 hertz. There was normal anterior posterior gradient noted. Background was well organized, continuous and fairly symmetric with no focal slowing.  Drowsiness and sleep were not seen in this recording.    There were occasional muscle and blinking artifacts noted.  The recording during hyperventilation included significant movement artifact, however only minimal slowing and no evident epileptic activity. Photic stimulation using stepwise increase in photic frequency did not change background activity.   Throughout the recording there were no focal or generalized epileptiform activities in the form of spikes or sharps noted. There were no transient rhythmic activities or electrographic seizures noted.  One lead EKG rhythm strip revealed sinus rhythm at a rate of 65 bpm.  Impression: This is a abnormal record for age with the patient in awake state due to mild background slowing consistent with known autism, however no evidence of epileptic acitvity.  Clinical correlation advised, but recording reassuring for goal of decreasing medications.   Lorenz Coaster MD MPH

## 2020-03-30 ENCOUNTER — Telehealth (INDEPENDENT_AMBULATORY_CARE_PROVIDER_SITE_OTHER): Payer: Self-pay | Admitting: Family

## 2020-03-30 NOTE — Telephone Encounter (Signed)
I called and spoke to Mom. I told her that the EEG results were normal. Mom wants to taper and discontinue the Topiramate so I gave her instructions to reduce by 1/2 tablet per week until the medication is stopped. I asked her to let me know if Eileen has any seizures or if she has any concerns. Mom agreed with this plan. TG

## 2020-03-30 NOTE — Telephone Encounter (Signed)
I called and left a message for Mom asking her to call me back to review [name]'s EEG results. TG

## 2020-08-25 ENCOUNTER — Other Ambulatory Visit: Payer: Self-pay

## 2020-08-25 ENCOUNTER — Encounter (INDEPENDENT_AMBULATORY_CARE_PROVIDER_SITE_OTHER): Payer: Self-pay | Admitting: Family

## 2020-08-25 ENCOUNTER — Ambulatory Visit (INDEPENDENT_AMBULATORY_CARE_PROVIDER_SITE_OTHER): Payer: 59 | Admitting: Family

## 2020-08-25 VITALS — BP 110/74 | HR 76 | Ht 63.0 in | Wt 179.2 lb

## 2020-08-25 DIAGNOSIS — G43009 Migraine without aura, not intractable, without status migrainosus: Secondary | ICD-10-CM | POA: Diagnosis not present

## 2020-08-25 DIAGNOSIS — G40309 Generalized idiopathic epilepsy and epileptic syndromes, not intractable, without status epilepticus: Secondary | ICD-10-CM

## 2020-08-25 DIAGNOSIS — F840 Autistic disorder: Secondary | ICD-10-CM | POA: Diagnosis not present

## 2020-08-25 DIAGNOSIS — G40209 Localization-related (focal) (partial) symptomatic epilepsy and epileptic syndromes with complex partial seizures, not intractable, without status epilepticus: Secondary | ICD-10-CM | POA: Diagnosis not present

## 2020-08-25 MED ORDER — LEVETIRACETAM 250 MG PO TABS: 500.0000 mg | ORAL_TABLET | Freq: Two times a day (BID) | ORAL | 5 refills | Status: DC

## 2020-08-25 MED ORDER — TOPIRAMATE 100 MG PO TABS: ORAL_TABLET | ORAL | 5 refills | Status: DC

## 2020-08-25 NOTE — Progress Notes (Signed)
[name]   MRN:  [id number]  02-Jul-1990   Provider: [name] NP-C Location of Care: [facility]rology  Visit type: Routine visit  Last visit: 02/25/2020  Referral source: Warrick Parisian, MD History from: mother, patient, and chcn chart  Brief history:  Copied from previous record: History of autism, seizures and migraine headaches. She is taking and tolerating Levetiracetam and Topiramate for her seizure disorder and has remained seizure free since October 2011. She has occasional episodes in which she becomes pale and goes to her bed to lie down. These are thought to be migraine because her condition improves after a nap. These occur about once per month. She has had problems with mood and behavior, with screaming and being difficult to console. Being involved in a day program and staying on a regular schedule with daily outings has helped with this in the past.  Today's concerns:  Mom reports today that [name] has remained seizure free since her last visit. When I saw her in October 2021, I talked with Mom about tapering the Topiramate and Mom did so slowly. She stopped at 100mg  twice per day because [name] began having headache symptoms when the dose was lower than that. Mom notes that [name] occasionally looks weak around her eyes, as if she may be having a migraine headache but that it doesn't progress to pallor and nausea as seen before. Mom says that [name] does not skip meals and usually drinks water all during the day.   [name] has been otherwise generally healthy since she was last seen. Mom has no other health concerns for [name] today other than previously mentioned.  Review of systems: Please see HPI for neurologic and other pertinent review of systems. Otherwise all other systems were reviewed and were negative.  Problem List: Patient Active Problem List   Diagnosis Date Noted  . History of weight gain 11/22/2014  . Insomnia secondary to  anxiety 05/24/2014  . Anxiety state 11/04/2013  . 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 Partial epilepsy with impairment of consciousness (HCC) 11/05/2012  . Active autistic disorder 11/05/2012  . Migraine without aura 11/05/2012  . Autism spectrum disorder 11/05/2012     Past Medical History:  Diagnosis Date  . Autism   . Migraines   . Seizures (HCC)     Past medical history comments: See HPI Copied from previous record: Diagnosis of autism was confirmed when she was 30 years of age at the [facility] at the [facility] in [place]. She was diagnosed with undifferentiated autism. She had a normal EEG and CT scan. I do not know she had chromosomal evaluations.  The patient has been tried with a variety of treatments including vitamin B12, DMG, herbal therapy, angulating therapy as well as other vitamins. These have been of marginal benefit to this patient.  She has a history of anxiety and migraines.  School tested [name] and found that she has an IQ in the range of 50 and undifferentiated autism. They also noted a mixed receptive and expressive language disorder.  Status epilepticus event May 2006  Status epilepticus event 02/02/2010 that occurred at school.  Surgical history: Past Surgical History:  Procedure Laterality Date  . MOUTH SURGERY  Summer 2009  . TOE SURGERY Right    toenail   . WISDOM TOOTH EXTRACTION       Family history: family history includes Autoimmune disease in her maternal grandmother  and mother; Cancer in her paternal grandmother; Healthy in her brother, brother, brother, brother, father, and sister; Lupus in her maternal aunt and mother; Rheumatologic disease in her mother.   Social history: Social History   Socioeconomic History  . Marital status: Single    Spouse name: Not on file  . Number of children: Not on file  . Years of education: Not on file   . Highest education level: Not on file  Occupational History  . Not on file  Tobacco Use  . Smoking status: Never Smoker  . Smokeless tobacco: Never Used  Vaping Use  . Vaping Use: Never used  Substance and Sexual Activity  . Alcohol use: No  . Drug use: No  . Sexual activity: Never  Other Topics Concern  . Not on file  Social History Narrative   She graduated from [employer] with a certificate in June 2015. She lives at home with her parents and 2 of her 5 siblings. She enjoys shopping, bowling, going to the park for walks, and watching videos.   [name] will be attending [employer]. 3 d a week   Social Determinants of Corporate investment banker Strain: Not on file  Food Insecurity: Not on file  Transportation Needs: Not on file  Physical Activity: Not on file  Stress: Not on file  Social Connections: Not on file  Intimate Partner Violence: Not on file    Past/failed meds: Copied from previous record: Fluoxetine - not beneficial for mood and behavior.  Allergies: No Known Allergies   Immunizations:  There is no immunization history on file for this patient.    Diagnostics/Screenings: Copied from previous record: rEEG - 10/21/2003 - normal awake. W.Hickling, MD  rEEG - 02/06/2010 - normal awake - W.Hickling, MD  Physical Exam: BP 110/74   Pulse 76   Ht 5\' 3"  (1.6 m)   Wt 179 lb 3.2 oz (81.3 kg)   BMI 31.74 kg/m   General: well developed, well nourished young woman, seated on exam table, in no evident distress; brown hair, brown eyes, right handed Head: normoocephalic and atraumatic. Oropharynx examination is limited by her ability to cooperate but appears benign. No dysmorphic features. Neck: supple Cardiovascular: regular rate and rhythm, no murmurs. Respiratory: clear to auscultation bilaterally Abdomen: bowel sounds present all four quadrants, abdomen soft, non-tender, non-distended. No hepatosplenomegaly or masses  palpated. Musculoskeletal: no skeletal deformities or obvious scoliosis.  Skin: no rashes or neurocutaneous lesions  Neurologic Exam Mental Status: awake and fully alert. Has very limited language.  Said hi to me when I entered the room, ok once when responding to a direction for the examination, and repeated the work lunch when her mother stated that they were going out to lunch after this visit. Smiles responsively. Tolerant of invasions into her space Cranial Nerves: fundoscopic exam - red reflex present.  Unable to fully visualize fundus.  Pupils equal briskly reactive to light.  Turns to localize faces and objects in the periphery. Turns to localize sounds in the periphery. Facial movements are symmetric.  Motor: normal functional bulk, tone and strength Sensory: withdrawal x 4 Coordination: unable to adequately assess due to patient's inability to participate in examination. No dysmetria when reaching for objects. Gait and Station: gait and stance normal  Impression: Generalized convulsive epilepsy (HCC) - Plan: topiramate (TOPAMAX) 100 MG tablet, levETIRAcetam (KEPPRA) 250 MG tablet  Partial epilepsy with impairment of consciousness (HCC) - Plan: topiramate (TOPAMAX) 100 MG tablet, levETIRAcetam (KEPPRA) 250  MG tablet  Migraine without aura and without status migrainosus, not intractable  Autism spectrum disorder   Recommendations for plan of care: The patient's previous [facility] records were reviewed. [place] has neither had nor required imaging or lab studies since the last visit. She is a 30 year old young woman with history of autism, migraine headaches and seizures. She is taking and tolerating Levetiracetam for seizures and has remained seizure free since October 2011, She is taking and tolerating Topirmate for migraine prophylaxis. Mom has reduced the Topiramate dose by half and Marquesa has tolerated that well without increase in headache events. She will continue on her medications  without change for now. I asked Mom to let me know if Niambi has a breakthrough seizure. Otherwise I will see her in follow up in 6 months or sooner if needed. Mom agreed with the plans made today.   The medication list was reviewed and reconciled. No changes were made in the prescribed medications today. A complete medication list was provided to the patient.  Return in about 6 months (around 02/24/2021).   Allergies as of 08/25/2020   No Known Allergies     Medication List       Accurate as of August 25, 2020  1:45 PM. If you have any questions, ask your nurse or doctor.        diazepam 5 MG tablet Commonly known as: VALIUM Reported on 06/01/2015   levETIRAcetam 250 MG tablet Commonly known as: KEPPRA Take 2 tablets (500 mg total) by mouth 2 (two) times daily.   multivitamin with minerals Tabs tablet Take 1 tablet by mouth daily.   topiramate 100 MG tablet Commonly known as: TOPAMAX TAKE 1 TABLETS (100 MG TOTAL) BY MOUTH 2 TIMES DAILY. What changed: additional instructions Changed by: [name], NP       Total time spent with the patient was 20 minutes, of which 50% or more was spent in counseling and coordination of care.  [name] NP-C [facility] Ph. [phone] Fax [phone]

## 2020-08-25 NOTE — Patient Instructions (Signed)
Thank you for coming in today.   Instructions for you until your next appointment are as follows: 1. Continue taking the Levetiracetam and Topiramate as prescribed.  2. Let me know if you have any seizures or if your headaches become more frequent or more severe. 3. Please sign up for MyChart if you have not done so. 4. Please plan to return for follow up in 6 months or sooner if needed.  At [facility], we are committed to providing exceptional care. You will receive a patient satisfaction survey through text or email regarding your visit today. Your opinion is important to me. Comments are appreciated.

## 2020-09-04 ENCOUNTER — Encounter (INDEPENDENT_AMBULATORY_CARE_PROVIDER_SITE_OTHER): Payer: Self-pay

## 2020-11-15 ENCOUNTER — Telehealth (INDEPENDENT_AMBULATORY_CARE_PROVIDER_SITE_OTHER): Payer: Self-pay | Admitting: Family

## 2020-11-15 DIAGNOSIS — G40309 Generalized idiopathic epilepsy and epileptic syndromes, not intractable, without status epilepticus: Secondary | ICD-10-CM

## 2020-11-15 DIAGNOSIS — G40209 Localization-related (focal) (partial) symptomatic epilepsy and epileptic syndromes with complex partial seizures, not intractable, without status epilepticus: Secondary | ICD-10-CM

## 2020-11-15 MED ORDER — TOPIRAMATE 100 MG PO TABS: ORAL_TABLET | ORAL | 5 refills | Status: DC

## 2020-11-15 NOTE — Telephone Encounter (Signed)
Mom called back and said that since the Topiramate dose had decreased that [name] has had more migraine headaches. She wants to put the Topiramate dose back to 2 tablets twice per day. I asked Mom to let me know if this does not help. I updated the Rx at the pharmacy. TG

## 2020-11-15 NOTE — Telephone Encounter (Signed)
I left a message and invited Mom to call back. TG 

## 2020-11-15 NOTE — Telephone Encounter (Signed)
Who's calling (name and relationship to patient) : [name] mom   Best contact number: [phone]  Provider they see: [name]  Reason for call: Would like to discuss medication and changing the current routine  Call ID:      PRESCRIPTION REFILL ONLY  Name of prescription:  Pharmacy:

## 2020-12-26 ENCOUNTER — Encounter (INDEPENDENT_AMBULATORY_CARE_PROVIDER_SITE_OTHER): Payer: Self-pay | Admitting: Family

## 2020-12-26 ENCOUNTER — Ambulatory Visit (INDEPENDENT_AMBULATORY_CARE_PROVIDER_SITE_OTHER): Payer: 59 | Admitting: Family

## 2020-12-26 ENCOUNTER — Other Ambulatory Visit: Payer: Self-pay

## 2020-12-26 VITALS — BP 100/78 | HR 80 | Ht 62.6 in | Wt 174.4 lb

## 2020-12-26 DIAGNOSIS — G40209 Localization-related (focal) (partial) symptomatic epilepsy and epileptic syndromes with complex partial seizures, not intractable, without status epilepticus: Secondary | ICD-10-CM

## 2020-12-26 DIAGNOSIS — F840 Autistic disorder: Secondary | ICD-10-CM

## 2020-12-26 DIAGNOSIS — G43009 Migraine without aura, not intractable, without status migrainosus: Secondary | ICD-10-CM | POA: Diagnosis not present

## 2020-12-26 DIAGNOSIS — F411 Generalized anxiety disorder: Secondary | ICD-10-CM

## 2020-12-26 DIAGNOSIS — G40309 Generalized idiopathic epilepsy and epileptic syndromes, not intractable, without status epilepticus: Secondary | ICD-10-CM

## 2020-12-26 MED ORDER — PROPRANOLOL HCL 10 MG PO TABS: ORAL_TABLET | ORAL | 3 refills | Status: DC

## 2020-12-26 NOTE — Patient Instructions (Signed)
Thank you for coming in today.   Instructions for you until your next appointment are as follows: Continue the Topiramate as prescribed Start Propranolol 10mg  - 1 tablet at bedtime Try using an ice pack and offering her something to drink when Ashleyann behaves as if she has a headache. If she continues to seem to be in pain after 15 minutes, give her Motrin 400mg  and encourage her to rest.  Work on getting Daenerys to drink more fluids - she should be getting about 48 oz of fluid each day as a minimum Try to limit day time naps so that Maylea will sleep better at night. Call me in 2 weeks to let me know how she is doing.  Please sign up for MyChart if you have not done so. Please plan to return for follow up in 3 months or sooner if needed.  At [facility], we are committed to providing exceptional care. You will receive a patient satisfaction survey through text or email regarding your visit today. Your opinion is important to me. Comments are appreciated.

## 2021-01-02 ENCOUNTER — Encounter (INDEPENDENT_AMBULATORY_CARE_PROVIDER_SITE_OTHER): Payer: Self-pay | Admitting: Family

## 2021-01-02 NOTE — Progress Notes (Signed)
[name]   MRN:  [id number]  [birth year]   Provider: [name] NP-C Location of Care: [facility]  Visit type: Routine return visit  Last visit: 08/25/20  Referral source: Warrick Parisian, MD History from: Epic chart and patient's mother  Brief history:  Copied from previous record: History of autism, seizures and migraine headaches. She is taking and tolerating Levetiracetam and Topiramate for her seizure disorder and has remained seizure free since October 2011. She has occasional episodes in which she becomes pale and goes to her bed to lie down. These are thought to be migraine because her condition improves after a nap. These occur about once per month. She has had problems with mood and behavior, with screaming and being difficult to console. Being involved in a day program and staying on a regular schedule with daily outings has helped with this in the past.  Today's concerns: Mom reports today that [name] has remained seizure free since her last visit. Mom is concerned because [name] has behaviors consistent with headaches almost daily in which she places her mother's hand on her head and cries. Mom has felt a tensing sensation at times when placing her hand on [name]'s head. Mom has been treating these events with an ice pack and Motrin but is concerned about her taking Motrin everyday. She estimates that the symptoms last about 30 minutes. [name] is taking Topiramate for migraine prevention. She does not sleep well at night and tends to sleep late every day, as well as naps in the afternoons. She doesn't drink much water and has to be prompted to take sips during the day.   [name] continues to display anxious behavior and has difficulty separating from her mother. She has occasional screaming and "meltdowns", particularly in social situations or her daily routine changes.   [name] has been otherwise generally healthy since she was last seen. Mom  has no other health concerns for [name] today other than previously mentioned.  Review of systems: Please see HPI for neurologic and other pertinent review of systems. Otherwise all other systems were reviewed and were negative.  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  She was diagnosed with undifferentiated autism.  She had a normal EEG and CT scan.  I do not know she had chromosomal evaluations.   The patient has been tried with a variety of treatments including vitamin B12, DMG, herbal therapy, angulating therapy as well as other vitamins.  These  have been of marginal benefit to this patient.   She has a history of anxiety and migraines.   School  tested [name] and found that she has an IQ in the range of 50 and undifferentiated autism.  They also noted a mixed receptive and expressive language  disorder.    Status epilepticus event May 2006   Status epilepticus event 02/02/2010 that occurred at school.  Surgical history: Past Surgical History:  Procedure Laterality Date   MOUTH SURGERY  Summer 2009   TOE SURGERY Right    toenail    WISDOM TOOTH EXTRACTION       Family history: family history includes Autoimmune disease in her maternal grandmother and mother; Cancer in her paternal  grandmother; Healthy in her brother, brother, brother, brother, father, and sister; Lupus in her maternal aunt and mother; Rheumatologic disease in her mother.   Social history: Social History   Socioeconomic History   Marital status: Single    Spouse name: Not on file   Number of children: Not on file   Years of education: Not on file   Highest education level: Not on file  Occupational History   Not on file  Tobacco Use   Smoking status: Never   Smokeless tobacco: Never  Vaping Use   Vaping Use: Never used  Substance and Sexual Activity   Alcohol use: No   Drug use: No   Sexual activity: Never  Other Topics Concern   Not on file  Social History Narrative   She graduated from [employer] with a certificate in June 2015. She lives at home with her parents and 2 of her 5 siblings. She enjoys shopping, bowling, going to the park for walks, and watching videos.   Louvenia will be attending [employer]. 3 d a week   Social Determinants of Corporate investment banker Strain: Not on file  Food Insecurity: Not on file  Transportation Needs: Not on file  Physical Activity: Not on file  Stress: Not on file  Social Connections: Not on file  Intimate Partner Violence: Not on file    Past/failed meds: Copied from previous record: Fluoxetine - not beneficial for mood and behavior.   Allergies: No Known Allergies   Immunizations:  There is no immunization history on file for this patient.   Diagnostics/Screenings: Copied from previous record: rEEG - 10/21/2003 - normal awake. W.Hickling, MD   rEEG - 02/06/2010 - normal awake - W.Hickling, MD  Physical Exam: BP 100/78   Pulse 80   Ht 5' 2.6" (1.59 m)   Wt 174 lb 6.4 oz (79.1 kg)   BMI 31.29 kg/m   General: Well developed, well nourished woman, seated on exam table, in no evident distress, brown hair, brown eyes, right handed Head: Head normocephalic and atraumatic.  Oropharynx difficult to examine  due to inability to cooperate but appears benign. Neck: Supple Cardiovascular: Regular rate and rhythm, no murmurs Respiratory: Breath sounds clear to auscultation Musculoskeletal: No obvious deformities or scoliosis Skin: No rashes or neurocutaneous lesions  Neurologic Exam Mental Status: Awake and fully alert. Very limited language. Said hi to me with prompting from her mother and said eat when Mom told her that when they left they would go out to lunch. Smiles responsively. Very limited eye contact. Had hand flapping behavior at times.  Cranial Nerves: Fundoscopic exam reveals sharp disc margins.  Pupils equal, briskly reactive to light.  Extraocular movements full without nystagmus. Hearing intact and symmetric to voice.  Facial sensation intact.  Face tongue, palate move normally and symmetrically. Motor: Normal functional bulk, tone and strength Sensory: Withdrawal x 4 Coordination: Unable to adequately assess due to her inability to participate in examination. No dysmetria when reaching for objects.  Gait and Station: Arises from chair without  difficulty.  Stance is normal. Gait demonstrates normal stride length and balance.  Impression: Generalized convulsive epilepsy (HCC)  Migraine without aura and without status migrainosus, not intractable - Plan: propranolol (INDERAL) 10 MG tablet  Partial epilepsy with impairment of consciousness (HCC)  Autism spectrum disorder  Anxiety state   Recommendations for plan of care: The patient's previous [facility] records were reviewed. [name] has neither had nor required imaging or lab studies since the last visit. She is a 30 year old woman with history of autism, epilepsy, migraine and tension headaches, insomnia and anxiety. She has been seizure free on Levetiracetam since October 2011. She is taking and tolerating Topiramate for migraine prevention. [name] is having daily behaviors that may represent tension headaches. I talked with her mother  and recommended a trial of Propranolol, as well as treating the headaches with ice packs, fluids and rest before giving Motrin each day. We talked about helping [name] to get more sleep at night. I asked Mom to call me in 2 weeks to let me know how [name] is doing. I will otherwise see her in follow up in 3 months or sooner if needed.   The medication list was reviewed and reconciled. I reviewed changes that were made in the prescribed medications today. A complete medication list was provided to the patient.  Return in about 3 months (around 03/28/2021).   Allergies as of 12/26/2020   No Known Allergies      Medication List        Accurate as of December 26, 2020 11:59 PM. If you have any questions, ask your nurse or doctor.          diazepam 5 MG tablet Commonly known as: VALIUM Reported on 06/01/2015   levETIRAcetam 250 MG tablet Commonly known as: KEPPRA Take 2 tablets (500 mg total) by mouth 2 (two) times daily.   multivitamin with minerals Tabs tablet Take 1 tablet by mouth daily.   propranolol 10 MG tablet Commonly known as: INDERAL Give 1 tablet at bedtime Started by: [name], NP   topiramate 100 MG tablet Commonly known as: TOPAMAX TAKE 2 TABLETS BY MOUTH 2 TIMES DAILY.        Total time spent with the patient was 20 minutes, of which 50% or more was spent in counseling and coordination of care.  [name] NP-C [facility] Child Neurology Ph. [phone] Fax [phone]

## 2021-05-12 ENCOUNTER — Other Ambulatory Visit (INDEPENDENT_AMBULATORY_CARE_PROVIDER_SITE_OTHER): Payer: Self-pay | Admitting: Family

## 2021-05-12 DIAGNOSIS — G40309 Generalized idiopathic epilepsy and epileptic syndromes, not intractable, without status epilepticus: Secondary | ICD-10-CM

## 2021-05-12 DIAGNOSIS — G40209 Localization-related (focal) (partial) symptomatic epilepsy and epileptic syndromes with complex partial seizures, not intractable, without status epilepticus: Secondary | ICD-10-CM

## 2021-06-12 ENCOUNTER — Other Ambulatory Visit: Payer: Self-pay

## 2021-06-12 ENCOUNTER — Ambulatory Visit (INDEPENDENT_AMBULATORY_CARE_PROVIDER_SITE_OTHER): Payer: 59 | Admitting: Family

## 2021-06-12 ENCOUNTER — Encounter (INDEPENDENT_AMBULATORY_CARE_PROVIDER_SITE_OTHER): Payer: Self-pay | Admitting: Family

## 2021-06-12 VITALS — BP 122/74 | HR 76 | Wt 180.6 lb

## 2021-06-12 DIAGNOSIS — G43009 Migraine without aura, not intractable, without status migrainosus: Secondary | ICD-10-CM | POA: Diagnosis not present

## 2021-06-12 DIAGNOSIS — G40309 Generalized idiopathic epilepsy and epileptic syndromes, not intractable, without status epilepticus: Secondary | ICD-10-CM

## 2021-06-12 DIAGNOSIS — F840 Autistic disorder: Secondary | ICD-10-CM | POA: Diagnosis not present

## 2021-06-12 DIAGNOSIS — G40209 Localization-related (focal) (partial) symptomatic epilepsy and epileptic syndromes with complex partial seizures, not intractable, without status epilepticus: Secondary | ICD-10-CM | POA: Diagnosis not present

## 2021-06-12 DIAGNOSIS — F411 Generalized anxiety disorder: Secondary | ICD-10-CM

## 2021-06-12 MED ORDER — TOPIRAMATE 100 MG PO TABS: ORAL_TABLET | ORAL | 5 refills | Status: DC

## 2021-06-12 NOTE — Progress Notes (Signed)
[name]   MRN:  [id number]  13-Sep-1990   Provider: [name] NP-C Location of Care: [facility]  Visit type: Routine return visit  Last visit: 12/26/2020  Referral source: Warrick Parisian, MD History from: Epic chart and patient's mother  Brief history:  Copied from previous record: History of autism, seizures and migraine headaches. She is taking and tolerating Levetiracetam and Topiramate for her seizure disorder and has remained seizure free since October 2011. She has occasional episodes in which she becomes pale and goes to her bed to lie down. These are thought to be migraine because her condition improves after a nap. These occur about once per month. She has had problems with mood and behavior, with screaming and being difficult to console. Being involved in a day program and staying on a regular schedule with daily outings has helped with this in the past.  Today's concerns: When [name] was last seen, Propranolol was recommended for migraine prevention. Mom tells me today that she stopped it after a month or so because it was not helpful. Mom felt that the headaches may have been in part stress induced, so she made a plan of giving [name] a cold pack for her head and having her lie down. This helped give her relief in a short time and now [name] will sometimes go get the cold pack and lie down on her own, and does not report headaches at the time. Mom is satisfied with this plan at this time.   [name] has remained seizure free and has been otherwise generally healthy since she was last seen. Mom has no other health concerns for [name] today other than previously mentioned.  Review of systems: Please see HPI for neurologic and other pertinent review of systems. Otherwise all other systems were reviewed and were negative.  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  She was diagnosed with undifferentiated autism.  She had a normal EEG and CT scan.  I do not know she had chromosomal evaluations.   The patient has been tried with a variety of treatments including vitamin B12, DMG, herbal therapy, angulating therapy as well as other vitamins.  These  have been of marginal benefit to this patient.   She has a history of anxiety and migraines.   School  tested [name] and found that she has an IQ in the range of 50 and undifferentiated autism.  They also noted a mixed receptive and expressive language disorder.    Status epilepticus event May 2006   Status epilepticus event 02/02/2010 that occurred at school.    Surgical history: Past Surgical History:  Procedure Laterality Date   MOUTH SURGERY  Summer 2009   TOE SURGERY Right    toenail  WISDOM TOOTH EXTRACTION       Family history: family history includes Autoimmune disease in her maternal grandmother and mother; Cancer in her paternal grandmother; Healthy in her brother, brother, brother, brother, father, and sister; Lupus in her maternal aunt and mother; Rheumatologic disease in her mother.   Social history: Social History   Socioeconomic History   Marital status: Single    Spouse name: Not on file   Number of children: Not on file   Years of education: Not on file   Highest  education level: Not on file  Occupational History   Not on file  Tobacco Use   Smoking status: Never    Passive exposure: Never   Smokeless tobacco: Never  Vaping Use   Vaping Use: Never used  Substance and Sexual Activity   Alcohol use: No   Drug use: No   Sexual activity: Never  Other Topics Concern   Not on file  Social History Narrative   She graduated from [employer] with a certificate in June 2015. She lives at home with her parents and 2 of her 5 siblings. She enjoys shopping, bowling, going to the park for walks, and watching videos.   [name] will be attending [employer]. 3 d a week   Social Determinants of Corporate investment banker Strain: Not on file  Food Insecurity: Not on file  Transportation Needs: Not on file  Physical Activity: Not on file  Stress: Not on file  Social Connections: Not on file  Intimate Partner Violence: Not on file    Past/failed meds: Copied from previous record: Fluoxetine - not beneficial for mood and behavior. Propranolol - ineffective for headaches   Allergies: No Known Allergies    Immunizations:  There is no immunization history on file for this patient.   Diagnostics/Screenings: Copied from previous record: rEEG - 10/21/2003 - normal awake. W.Hickling, MD   rEEG - 02/06/2010 - normal awake - W.Hickling, MD   Physical Exam: BP 122/74    Pulse 76    Wt 180 lb 9.6 oz (81.9 kg)    BMI 32.40 kg/m   General: Well developed, well nourished woman, seated in exam room, in no evident distress Head: Head normocephalic and atraumatic.  Oropharynx difficult to examine because of her inability to cooperate with examination but appears benign. Neck: Supple Cardiovascular: Regular rate and rhythm, no murmurs Respiratory: Breath sounds clear to auscultation Musculoskeletal: No obvious deformities or scoliosis Skin: No rashes or neurocutaneous lesions  Neurologic Exam Mental Status: Awake and fully alert.  Attention span, concentration, and fund of knowledge subnormal for age. Very limited language. Gave some one word answers with prompts from her mother. Very limited eye contact. Some hand flapping behavior. Cranial Nerves: Fundoscopic exam reveals sharp disc margins.  Pupils equal, briskly reactive to light.  Extraocular movements full without nystagmus. Hearing intact and symmetric to whisper.  Facial sensation intact.  Face tongue, palate move normally and symmetrically. Motor: Normal functional bulk, tone and strength Sensory: Withdrawal x 4 Coordination: Unable to adequately assess due to patient's inability to participate in examination. No dysmetria when reaching for objects. Gait and Station: Arises from chair without difficulty.  Stance is normal. Gait demonstrates normal stride length and balance.   Impression: Autism spectrum disorder  Generalized convulsive epilepsy (HCC) - Plan: topiramate (TOPAMAX) 100 MG tablet  Partial epilepsy with impairment of consciousness (HCC) - Plan: topiramate (TOPAMAX) 100 MG tablet  Migraine without aura and without status migrainosus, not  intractable  Anxiety state    Recommendations for plan of care: The patient's previous [facility] records were reviewed. Betzaira has neither had nor required imaging or lab studies since the last visit.   The medication list was reviewed and reconciled. No changes were made in the prescribed medications today. A complete medication list was provided to the patient.  Return in about 6 months (around 12/10/2021).   Allergies as of 06/12/2021   No Known Allergies      Medication List        Accurate as of June 12, 2021 11:59 PM. If you have any questions, ask your nurse or doctor.          STOP taking these medications    propranolol 10 MG tablet Commonly known as: INDERAL Stopped by: [name], NP       TAKE these medications    diazepam 5 MG tablet Commonly known as: VALIUM Reported  on 06/01/2015   levETIRAcetam 250 MG tablet Commonly known as: KEPPRA TAKE 2 TABLETS BY MOUTH 2 TIMES DAILY.   multivitamin with minerals Tabs tablet Take 1 tablet by mouth daily.   topiramate 100 MG tablet Commonly known as: TOPAMAX TAKE 2 TABLETS BY MOUTH 2 TIMES DAILY.      Total time spent with the patient was 20 minutes, of which 50% or more was spent in counseling and coordination of care.  [name] NP-C [facility] Ph. [phone] Fax [phone]

## 2021-06-12 NOTE — Patient Instructions (Signed)
It was a pleasure to see you today!  Instructions for you until your next appointment are as follows: Continue [name]'s medications as prescribed.  I am happy to hear that the headaches are better.  Let me know if she has any seizures or if you have any concerns. Please sign up for MyChart if you have not done so. Please plan to return for follow up in 6 months or sooner if needed.    Feel free to contact our office during normal business hours at [phone] with questions or concerns. If there is no answer or the call is outside business hours, please leave a message and our clinic staff will call you back within the next business day.  If you have an urgent concern, please stay on the line for our after-hours answering service and ask for the on-call neurologist.     I also encourage you to use MyChart to communicate with me more directly. If you have not yet signed up for MyChart within [facility], the front desk staff can help you. However, please note that this inbox is NOT monitored on nights or weekends, and response can take up to 2 business days.  Urgent matters should be discussed with the on-call pediatric neurologist.   At [facility], we are committed to providing exceptional care. You will receive a patient satisfaction survey through text or email regarding your visit today. Your opinion is important to me. Comments are appreciated.

## 2021-06-15 ENCOUNTER — Encounter (INDEPENDENT_AMBULATORY_CARE_PROVIDER_SITE_OTHER): Payer: Self-pay | Admitting: Family

## 2021-11-07 ENCOUNTER — Other Ambulatory Visit (INDEPENDENT_AMBULATORY_CARE_PROVIDER_SITE_OTHER): Payer: Self-pay | Admitting: Family

## 2021-11-07 DIAGNOSIS — G40209 Localization-related (focal) (partial) symptomatic epilepsy and epileptic syndromes with complex partial seizures, not intractable, without status epilepticus: Secondary | ICD-10-CM

## 2021-11-07 DIAGNOSIS — G40309 Generalized idiopathic epilepsy and epileptic syndromes, not intractable, without status epilepticus: Secondary | ICD-10-CM

## 2021-12-10 NOTE — Progress Notes (Unsigned)
[name]   MRN:  [id number]  Aug 26, 1990   Provider: Rockwell Germany NP-C Location of Care: [facility]  Visit type: Return visit  Last visit: 06/12/2021  Referral source: Lynne Logan, MD History from: Epic chart and patient's mother  Brief history:  Copied from previous record: History of autism, seizures and migraine headaches. She is taking and tolerating Levetiracetam and Topiramate for her seizure disorder and has remained seizure free since October 2011. She has occasional episodes in which she becomes pale and goes to her bed to lie down. These are thought to be migraine because her condition improves after a nap. These occur about once per month. She has had problems with mood and behavior, with screaming and being difficult to console. Being involved in a day program and staying on a regular schedule with daily outings has helped with this in the past.  Today's concerns: Mom reports today that [name] has remained seizure free since her last visit. She has no problems getting her to take the seizure medication. [name] generally sleeps well at night.   [name] continues to experience events thought to be headache. When these occur she will stop activities and lie down on her bed. Sometimes Mom will attempt to distract her or reengage her in activities, but if she is pale, Mom applies an ice pack to her head, may give her Tylenol and allows her to rest. [name] usually returns to her usual baseline after a short rest. Mom has been unable to identify triggers for her behavior. Mom feels that these events have been more frequent recently and wonders if it is weather related.   [name] mom works to find activities each day for her. She likes to take a ride in the car, likes to go to a store to look at magazines and likes to grocery shop with her mother. She sometimes gets upset in public places and will have screaming behavior but Mom is usually able to  redirect her before that occurs.   [name] has gained some weight since her last visit and Mom is working on reducing calorie intake as [name] is fairly sedentary. She has been otherwise generally healthy since she was last seen. Mom has no other health concerns for her today other than previously mentioned.  Review of systems: Please see HPI for neurologic and other pertinent review of systems. Otherwise all other systems were reviewed and were negative.  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  She was diagnosed with undifferentiated autism.  She had a normal EEG and CT scan.  I do not know she had chromosomal evaluations.   The patient has been tried with a variety of treatments including vitamin B12, DMG, herbal therapy, angulating therapy as well as  other vitamins.  These  have been of marginal benefit to this patient.   She has a history of anxiety and migraines.   School  tested [name] and found that she has an IQ in the range of 50 and undifferentiated autism.  They also noted a mixed receptive and expressive language disorder.    Status epilepticus event May 2006   Status epilepticus event 02/02/2010 that occurred at school.  Surgical history: Past Surgical History:  Procedure  Laterality Date   MOUTH SURGERY  Summer 2009   TOE SURGERY Right    toenail    WISDOM TOOTH EXTRACTION       Family history: family history includes Autoimmune disease in her maternal grandmother and mother; Cancer in her paternal grandmother; Healthy in her brother, brother, brother, brother, father, and sister; Lupus in her maternal aunt and mother; Rheumatologic disease in her mother.   Social history: Social History   Socioeconomic History   Marital status: Single    Spouse name: Not on file   Number of children: Not on file   Years of education: Not on file   Highest education level: Not on file  Occupational History   Not on file  Tobacco Use   Smoking status: Never    Passive exposure: Never   Smokeless tobacco: Never  Vaping Use   Vaping Use: Never used  Substance and Sexual Activity   Alcohol use: No   Drug use: No   Sexual activity: Never  Other Topics Concern   Not on file  Social History Narrative   She graduated from [employer] with a certificate in June 2015. She lives at home with her parents and 2 of her 5 siblings. She enjoys shopping, bowling, going to the park for walks, and watching videos.   Annaly will be attending [employer]. 3 d a week   Social Determinants of Corporate investment banker Strain: Not on file  Food Insecurity: Not on file  Transportation Needs: Not on file  Physical Activity: Not on file  Stress: Not on file  Social Connections: Not on file  Intimate Partner Violence: Not on file    Past/failed meds: Copied from previous record: Fluoxetine - not beneficial for mood and behavior. Propranolol - ineffective for headaches  Allergies: No Known Allergies   Immunizations:  There is no immunization history on file for this patient.    Diagnostics/Screenings: Copied from previous record: rEEG - 10/21/2003 - normal awake. W.Hickling, MD   rEEG - 02/06/2010 - normal awake - W.Hickling, MD  Physical  Exam: BP 100/80   Pulse 89   Ht 5' 2.48" (1.587 m)   Wt 189 lb 13.1 oz (86.1 kg)   BMI 34.19 kg/m   Wt Readings from Last 3 Encounters:  12/11/21 189 lb 13.1 oz (86.1 kg)  06/12/21 180 lb 9.6 oz (81.9 kg)  12/26/20 174 lb 6.4 oz (79.1 kg)    General: well developed, well nourished woman, seated on exam table, in no evident distress Head: normocephalic and atraumatic. Oropharynx difficult to examine because of inability to cooperate but appears benign. No dysmorphic features. Neck: supple Cardiovascular: regular rate and rhythm, no murmurs. Respiratory: Clear to auscultation bilaterally Abdomen: Bowel sounds present all four quadrants, abdomen soft, non-tender, non-distended. Musculoskeletal: No skeletal deformities or obvious scoliosis Skin: no rashes or neurocutaneous lesions  Neurologic Exam Mental Status: Awake and fully alert.  Attention span and concentration subnormal for age. Very limited language, tends to have echolalia.  Very limited eye contact. Had hand flapping behaviors at times. Unable to follow most commands or participate in examination. Cranial Nerves: Fundoscopic exam - red reflex present.  Unable to fully visualize fundus.  Pupils equal briskly reactive to light.  Extraocular movements full without nystagmus. Turns to localize faces, objects and sounds in the periphery. Face, tongue, palate move normally and symmetrically.   Motor: Normal functional bulk, tone and strength Sensory: Intact to touch and temperature in all extremities. Coordination: No dysmetria when reaching for objects.  Gait and Station: Arises from chair, without difficulty. Stance is normal.  Gait demonstrates normal stride length and balance. Reflexes: Unable to adequately assess due to her inability to cooperate with examination.   Impression: Generalized convulsive epilepsy (HCC)  Migraine without aura and without status migrainosus, not intractable  Autism spectrum disorder  Anxiety state    Recommendations for plan of care: The patient's previous Epic records were reviewed. Jalia has neither had nor required imaging or lab studies since the last visit. She has remained seizure free on Levetiracetam and Topiramate. She continues to experience intermittent events that are thought to be migraine headaches. Abria has gained some weight since her last visit and Mom is working to reduce her calorie intake. I commended Mom for her efforts with [name]. I will see her back in follow up in 6 months or sooner if needed. Mom agreed with the plans made today.   The medication list was reviewed and reconciled. No changes were made in the prescribed medications today. A complete medication list was provided to the patient.  Return in about 6 months (around 06/13/2022).   Allergies as of 12/11/2021   No Known Allergies      Medication List        Accurate as of December 11, 2021 11:59 PM. If you have any questions, ask your nurse or doctor.          diazepam 5 MG tablet Commonly known as: VALIUM Reported on 06/01/2015   levETIRAcetam 250 MG tablet Commonly known as: KEPPRA TAKE 2 TABLETS BY MOUTH TWICE A DAY   multivitamin with minerals Tabs tablet Take 1 tablet by mouth daily.   topiramate 100 MG tablet Commonly known as: TOPAMAX TAKE 2 TABLETS BY MOUTH 2 TIMES DAILY.      Total time spent with the patient was 25 minutes, of which 50% or more was spent in counseling and coordination of care.  [name] NP-C [facility] Ph. [phone] Fax [phone]

## 2021-12-11 ENCOUNTER — Encounter (INDEPENDENT_AMBULATORY_CARE_PROVIDER_SITE_OTHER): Payer: Self-pay | Admitting: Family

## 2021-12-11 ENCOUNTER — Ambulatory Visit (INDEPENDENT_AMBULATORY_CARE_PROVIDER_SITE_OTHER): Payer: 59 | Admitting: Family

## 2021-12-11 VITALS — BP 100/80 | HR 89 | Ht 62.48 in | Wt 189.8 lb

## 2021-12-11 DIAGNOSIS — G40309 Generalized idiopathic epilepsy and epileptic syndromes, not intractable, without status epilepticus: Secondary | ICD-10-CM

## 2021-12-11 DIAGNOSIS — G40209 Localization-related (focal) (partial) symptomatic epilepsy and epileptic syndromes with complex partial seizures, not intractable, without status epilepticus: Secondary | ICD-10-CM

## 2021-12-11 DIAGNOSIS — F840 Autistic disorder: Secondary | ICD-10-CM

## 2021-12-11 DIAGNOSIS — G43009 Migraine without aura, not intractable, without status migrainosus: Secondary | ICD-10-CM | POA: Diagnosis not present

## 2021-12-11 DIAGNOSIS — F411 Generalized anxiety disorder: Secondary | ICD-10-CM | POA: Diagnosis not present

## 2021-12-11 NOTE — Patient Instructions (Signed)
It was a pleasure to see you today!  Instructions for you until your next appointment are as follows: Continue [name]'s medications as prescribed Let me know if the headaches become more frequent or more severe Please sign up for MyChart if you have not done so. Please plan to return for follow up in 6 months or sooner if needed.   Feel free to contact our office during normal business hours at [phone] with questions or concerns. If there is no answer or the call is outside business hours, please leave a message and our clinic staff will call you back within the next business day.  If you have an urgent concern, please stay on the line for our after-hours answering service and ask for the on-call neurologist.     I also encourage you to use MyChart to communicate with me more directly. If you have not yet signed up for MyChart within [facility], the front desk staff can help you. However, please note that this inbox is NOT monitored on nights or weekends, and response can take up to 2 business days.  Urgent matters should be discussed with the on-call pediatric neurologist.   At [facility], we are committed to providing exceptional care. You will receive a patient satisfaction survey through text or email regarding your visit today. Your opinion is important to me. Comments are appreciated.

## 2021-12-12 ENCOUNTER — Encounter (INDEPENDENT_AMBULATORY_CARE_PROVIDER_SITE_OTHER): Payer: Self-pay | Admitting: Family

## 2021-12-12 MED ORDER — TOPIRAMATE 100 MG PO TABS: ORAL_TABLET | ORAL | 5 refills | Status: DC

## 2021-12-18 ENCOUNTER — Encounter (INDEPENDENT_AMBULATORY_CARE_PROVIDER_SITE_OTHER): Payer: Self-pay

## 2022-03-16 ENCOUNTER — Telehealth (INDEPENDENT_AMBULATORY_CARE_PROVIDER_SITE_OTHER): Payer: Self-pay | Admitting: Family

## 2022-03-16 DIAGNOSIS — G40309 Generalized idiopathic epilepsy and epileptic syndromes, not intractable, without status epilepticus: Secondary | ICD-10-CM

## 2022-03-16 DIAGNOSIS — G40209 Localization-related (focal) (partial) symptomatic epilepsy and epileptic syndromes with complex partial seizures, not intractable, without status epilepticus: Secondary | ICD-10-CM

## 2022-03-16 MED ORDER — LEVETIRACETAM 750 MG PO TABS: 750.0000 mg | ORAL_TABLET | Freq: Two times a day (BID) | ORAL | 5 refills | Status: DC

## 2022-03-16 NOTE — Telephone Encounter (Signed)
I called and spoke with Mom. She said that Nalaya had a UTI in late October and was treated with antibiotic. She also had lab studies and was found to have a very low Vitamin D level and is receiving weekly treatment for that. Over the last couple of weeks, Mom has noted behaviors that she believes may be seizure. She said that [name] will stop activity, hold her hands up, be unresponsive to her name. If she is walking she stops walking and does these behaviors. The behaviors last about 30 seconds or so but have occurred several times per day. After the event, when Mom is trying to get her attention she will say "I am happy". There have been times when she has had a headache later in the day with crying and being listless, despite treatment with Tylenol. I recommended increasing the Levetiracetam dose to 750mg  BID and will send in an updated Rx for that. I asked Mom to let me know if the behaviors continue or if she has other concerns. Mom agreed with this plan. TG

## 2022-03-16 NOTE — Telephone Encounter (Signed)
  Name of who is calling:Cheri   Caller's Relationship to Patient:mother   Best contact number:[phone]   Provider they ZOX:WRUE Goodpasture   Reason for call:mom called to see if she could have a call back from [place] regarding some health issues that she has been noticing with Clotiel and mom is concerned. Please advise      PRESCRIPTION REFILL ONLY  Name of prescription:  Pharmacy:

## 2022-03-29 ENCOUNTER — Ambulatory Visit (INDEPENDENT_AMBULATORY_CARE_PROVIDER_SITE_OTHER): Payer: Self-pay | Admitting: Family

## 2022-06-11 ENCOUNTER — Ambulatory Visit (INDEPENDENT_AMBULATORY_CARE_PROVIDER_SITE_OTHER): Payer: 59 | Admitting: Family

## 2022-06-25 ENCOUNTER — Ambulatory Visit (INDEPENDENT_AMBULATORY_CARE_PROVIDER_SITE_OTHER): Payer: 59 | Admitting: Family

## 2022-06-25 ENCOUNTER — Encounter (INDEPENDENT_AMBULATORY_CARE_PROVIDER_SITE_OTHER): Payer: Self-pay | Admitting: Family

## 2022-06-25 VITALS — BP 112/86 | HR 86 | Ht 63.19 in | Wt 191.0 lb

## 2022-06-25 DIAGNOSIS — G43009 Migraine without aura, not intractable, without status migrainosus: Secondary | ICD-10-CM | POA: Diagnosis not present

## 2022-06-25 DIAGNOSIS — G40309 Generalized idiopathic epilepsy and epileptic syndromes, not intractable, without status epilepticus: Secondary | ICD-10-CM

## 2022-06-25 DIAGNOSIS — G40209 Localization-related (focal) (partial) symptomatic epilepsy and epileptic syndromes with complex partial seizures, not intractable, without status epilepticus: Secondary | ICD-10-CM | POA: Diagnosis not present

## 2022-06-25 DIAGNOSIS — F411 Generalized anxiety disorder: Secondary | ICD-10-CM

## 2022-06-25 DIAGNOSIS — F840 Autistic disorder: Secondary | ICD-10-CM | POA: Diagnosis not present

## 2022-06-25 NOTE — Progress Notes (Unsigned)
[name]   MRN:  [id number]  July 21, 1990   Provider: Rockwell Germany NP-C Location of Care: [facility]  Visit type: Return visit  Last visit: 12/12/18/23  Referral source: Corrington, Kip A, MD History from: Epic chart and patient's mother  Brief history:  Copied from previous record: History of autism, seizures and migraine headaches. She is taking and tolerating Levetiracetam and Topiramate for her seizure disorder and has remained seizure free since October 2011. She has occasional episodes in which she becomes pale and goes to her bed to lie down. These are thought to be migraine because her condition improves after a nap. These occur about once per month. She has had problems with mood and behavior, with screaming and being difficult to console. Being involved in a day program and staying on a regular schedule with daily outings has helped with this in the past.   Today's concerns: Has remained seizure free since last visit Episodes about every other day of headache. Tylenol helps.  Also has episodes at least once per day of crying Has worsening problems with socially isolating behavior. Doesn't want to go out of her home for the most part but is bored and restless at home. Likes to go to stores and walk around but gets agitated if anything changes the routine there. Once agitated, she is difficult for Mom to manage. Mom has been forced to restrain her at times because of concerns about elopement and fleeing.  Has upcoming meeting with Behavioral Specialist to make behavior plan.  Has had frequent cough. Has been treated with antibiotic twice  without good success. Has had some improvement with reflux medication.  Has sore inside her nose that is being treated with antibiotic ointment Is being treated for Vitamin D deficiency [name] has been otherwise generally healthy since she was last seen. No health concerns today other than  previously mentioned.  Review of systems: Please see HPI for neurologic and other pertinent review of systems. Otherwise all other systems were reviewed and were negative.  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  She was diagnosed with undifferentiated autism.  She had a normal EEG and CT scan.  I do not know she had chromosomal evaluations.   The patient has been tried with a variety of treatments including vitamin B12, DMG, herbal therapy, angulating therapy as well as other vitamins.  These  have been of marginal benefit to this patient.   She has a history of anxiety and migraines.   School  tested Rosalinda and found that she has an IQ in the range of 50 and undifferentiated autism.  They also noted a mixed receptive and expressive language disorder.  Status epilepticus event May 2006   Status epilepticus event 02/02/2010 that occurred at school.  Surgical history: Past Surgical History:  Procedure Laterality Date   MOUTH SURGERY  Summer 2009   TOE SURGERY Right    toenail    WISDOM TOOTH EXTRACTION       Family history: family history includes Autoimmune disease in her maternal grandmother and mother; Cancer in her paternal  grandmother; Healthy in her brother, brother, brother, brother, father, and sister; Lupus in her maternal aunt and mother; Rheumatologic disease in her mother.   Social history: Social History   Socioeconomic History   Marital status: Single    Spouse name: Not on file   Number of children: Not on file   Years of education: Not on file   Highest education level: Not on file  Occupational History   Not on file  Tobacco Use   Smoking status: Never    Passive exposure: Never   Smokeless tobacco: Never  Vaping Use   Vaping Use: Never used  Substance and Sexual Activity   Alcohol use: No   Drug use: No   Sexual activity: Never  Other Topics Concern   Not on file  Social History Narrative   She graduated from [employer] with a certificate in June [id number]. She lives at home with her parents and 2 of her 5 siblings. She enjoys shopping, bowling, going to the park for walks, and watching videos.   Joelie will be attending [employer]. 3 d a week   Social Determinants of Radio broadcast assistant Strain: Not on file  Food Insecurity: Not on file  Transportation Needs: Not on file  Physical Activity: Not on file  Stress: Not on file  Social Connections: Not on file  Intimate Partner Violence: Not on file   Past/failed meds: Copied from previous record: Fluoxetine - not beneficial for mood and behavior. Propranolol - ineffective for headaches   Allergies: No Known Allergies    Immunizations:  There is no immunization history on file for this patient.    Diagnostics/Screenings: Copied from previous record: rEEG - 10/21/2003 - normal awake. W.Hickling, MD   rEEG - 02/06/2010 - normal awake - W.Hickling, MD  Physical Exam: There were no vitals taken for this visit.  General: well developed, well nourished, seated, in no evident distress Head: normocephalic and atraumatic. Oropharynx benign. No dysmorphic features. Neck: supple Cardiovascular:  regular rate and rhythm, no murmurs. Respiratory: clear to auscultation bilaterally Abdomen: bowel sounds present all four quadrants, abdomen soft, non-tender, non-distended. No hepatosplenomegaly or masses palpated.Gastrostomy tube in place size *** Musculoskeletal: no skeletal deformities or obvious scoliosis. Has contractures**** Skin: no rashes or neurocutaneous lesions  Neurologic Exam Mental Status: awake and fully alert. Has no language.  Smiles responsively. Resistant to invasions into ***space Cranial Nerves: fundoscopic exam - red reflex present.  Unable to fully visualize fundus.  Pupils equal briskly reactive to light.  Turns to localize faces and objects in the periphery. Turns to localize sounds in the periphery. Facial movements are asymmetric, has lower facial weakness with drooling.  Neck flexion and extension *** abnormal with poor head control.  Motor: truncal hypotonia.  *** spastic quadriparesis  Sensory: withdrawal x 4 Coordination: unable to adequately assess due to patient's inability to participate in examination. No dysmetria when reaching for objects. Gait and Station: unable to independently stand and bear weight. Able to stand with assistance but needs constant support. Able to take a few steps  but has poor balance and needs support.  Reflexes: diminished and symmetric. Toes neutral. No clonus   Impression: No diagnosis found.    Recommendations for plan of care: The patient's previous Epic records were reviewed. No recent diagnostic studies to be reviewed with the patient.  Plan until next visit: Continue medications as prescribed  Reminded -  Call if  No follow-ups on file.  The medication list was reviewed and reconciled. No changes were made in the prescribed medications today. A complete medication list was provided to the patient.  No orders of the defined types were placed in this encounter.    Allergies as of 06/25/2022   No Known Allergies       Medication List        Accurate as of June 25, 2022 11:12 AM. If you have any questions, ask your nurse or doctor.          diazepam 5 MG tablet Commonly known as: VALIUM Reported on 06/01/2015   levETIRAcetam 750 MG tablet Commonly known as: KEPPRA Take 1 tablet (750 mg total) by mouth 2 (two) times daily.   multivitamin with minerals Tabs tablet Take 1 tablet by mouth daily.   topiramate 100 MG tablet Commonly known as: TOPAMAX TAKE 2 TABLETS BY MOUTH 2 TIMES DAILY.            I discussed this patient's care with the multiple providers involved in her care today to develop this assessment and plan.   Total time spent with the patient was *** minutes, of which 50% or more was spent in counseling and coordination of care.  Rockwell Germany NP-C [facility] [id number] [place], [place] [address] Ph. [phone] Fax [phone]

## 2022-06-26 ENCOUNTER — Encounter (INDEPENDENT_AMBULATORY_CARE_PROVIDER_SITE_OTHER): Payer: Self-pay | Admitting: Family

## 2022-06-26 NOTE — Patient Instructions (Signed)
It was a pleasure to see you today!  Instructions for you until your next appointment are as follows: Continue [name]'s medications as prescribed for now Let me know if you feel that [name] needs something for mood and behavior. Be sure to keep the appointment with the behavior specialist.  Let me know if seizures occur Please sign up for MyChart if you have not done so. Please plan to return for follow up in 6 months or sooner if needed.  Feel free to contact our office during normal business hours at [phone] with questions or concerns. If there is no answer or the call is outside business hours, please leave a message and our clinic staff will call you back within the next business day.  If you have an urgent concern, please stay on the line for our after-hours answering service and ask for the on-call neurologist.     I also encourage you to use MyChart to communicate with me more directly. If you have not yet signed up for MyChart within [facility], the front desk staff can help you. However, please note that this inbox is NOT monitored on nights or weekends, and response can take up to 2 business days.  Urgent matters should be discussed with the on-call pediatric neurologist.   At [facility], we are committed to providing exceptional care. You will receive a patient satisfaction survey through text or email regarding your visit today. Your opinion is important to me. Comments are appreciated.

## 2022-08-11 ENCOUNTER — Other Ambulatory Visit (INDEPENDENT_AMBULATORY_CARE_PROVIDER_SITE_OTHER): Payer: Self-pay | Admitting: Family

## 2022-08-11 DIAGNOSIS — G40309 Generalized idiopathic epilepsy and epileptic syndromes, not intractable, without status epilepticus: Secondary | ICD-10-CM

## 2022-08-11 DIAGNOSIS — G40209 Localization-related (focal) (partial) symptomatic epilepsy and epileptic syndromes with complex partial seizures, not intractable, without status epilepticus: Secondary | ICD-10-CM

## 2022-08-12 ENCOUNTER — Other Ambulatory Visit (INDEPENDENT_AMBULATORY_CARE_PROVIDER_SITE_OTHER): Payer: Self-pay | Admitting: Family

## 2022-08-12 DIAGNOSIS — G40209 Localization-related (focal) (partial) symptomatic epilepsy and epileptic syndromes with complex partial seizures, not intractable, without status epilepticus: Secondary | ICD-10-CM

## 2022-08-12 DIAGNOSIS — G40309 Generalized idiopathic epilepsy and epileptic syndromes, not intractable, without status epilepticus: Secondary | ICD-10-CM

## 2022-11-13 ENCOUNTER — Other Ambulatory Visit (INDEPENDENT_AMBULATORY_CARE_PROVIDER_SITE_OTHER): Payer: Self-pay | Admitting: Family

## 2022-11-13 DIAGNOSIS — G40309 Generalized idiopathic epilepsy and epileptic syndromes, not intractable, without status epilepticus: Secondary | ICD-10-CM

## 2022-11-13 DIAGNOSIS — G40209 Localization-related (focal) (partial) symptomatic epilepsy and epileptic syndromes with complex partial seizures, not intractable, without status epilepticus: Secondary | ICD-10-CM

## 2022-12-26 NOTE — Progress Notes (Signed)
[name]   MRN:  [id number]  10/06/1990   Provider: [name] NP-C Location of Care: [facility]  Visit type: Return visit  Last visit: 06/25/2022  Referral source: Corrington, Kip A, MD History from: Epic chart and patient's mother   Brief history:  Copied from previous record: History of autism, seizures and migraine headaches. She is taking and tolerating Levetiracetam and Topiramate for her seizure disorder and has remained seizure free since October 2011. She has occasional episodes in which she becomes pale and goes to her bed to lie down. These are thought to be migraine because her condition improves after a nap. These occur about once per month. She has had problems with mood and behavior, with screaming and being difficult to console. Being involved in a day program and staying on a regular schedule with daily outings has helped with this in the past.    Today's concerns: Mom reports that she has remained seizure free Behavior has improved, with fewer outbursts When last seen she had developed chronic cough. Mom reports that medications did not help. Mom changed her diet and coughing stopped.  [name] has been otherwise generally healthy since she was last seen. No health concerns today other than previously mentioned.  Review of systems: Please see HPI for neurologic and other pertinent review of systems. Otherwise all other systems were reviewed and were negative.  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  She was diagnosed with undifferentiated autism.  She had a normal EEG and CT scan.  I do not know she had chromosomal evaluations.   The patient has been tried with a variety of treatments including vitamin B12, DMG, herbal therapy, angulating therapy as well as other vitamins.  These  have been of marginal benefit to this patient.   She has a history of anxiety and migraines.   School  tested Maryjoan and found that she has an IQ in the range of 50 and undifferentiated autism.  They also noted a mixed receptive and expressive language disorder.    Status epilepticus event May 2006   Status epilepticus event 02/02/2010 that occurred at school.  Surgical history: Past Surgical History:  Procedure Laterality Date   MOUTH SURGERY  Summer 2009   TOE SURGERY Right    toenail    WISDOM TOOTH EXTRACTION       Family history: family history includes Autoimmune disease in her maternal grandmother and mother; Cancer in her paternal grandmother; Healthy in her brother, brother, brother, brother, father, and sister; Lupus in her maternal aunt and mother; Rheumatologic disease in her mother.   Social history: Social History   Socioeconomic History   Marital status: Single  Spouse name: Not on file   Number of children: Not on file   Years of education: Not on file   Highest education level: Not on file  Occupational History   Not on file  Tobacco Use   Smoking status: Never    Passive exposure: Never   Smokeless tobacco: Never  Vaping Use   Vaping status: Never Used  Substance and Sexual Activity   Alcohol use: No   Drug use: No   Sexual  activity: Never  Other Topics Concern   Not on file  Social History Narrative   She graduated from [employer] with a certificate in June 2015. She lives at home with her parents and 2 of her 5 siblings. She enjoys shopping, bowling, going to the park for walks, and watching videos.   Social Determinants of Health   Financial Resource Strain: Low Risk  (09/12/2022)   Received from [facility]   Overall Financial Resource Strain (CARDIA)    Difficulty of Paying Living Expenses: Not hard at all  Food Insecurity: No Food Insecurity (09/12/2022)   Received from [facility]   Hunger Vital Sign    Worried About Running Out of Food in the Last Year: Never true    Ran Out of Food in the Last Year: Never true  Transportation Needs: No Transportation Needs (09/12/2022)   Received from [facility] - Transportation    Lack of Transportation (Medical): No    Lack of Transportation (Non-Medical): No  Physical Activity: Unknown (09/12/2022)   Received from [facility]   Exercise Vital Sign    Days of Exercise per Week: 0 days    Minutes of Exercise per Session: Not on file  Stress: No Stress Concern Present (09/12/2022)   Received from [facility] - Occupational Stress Questionnaire    Feeling of Stress : Only a little  Social Connections: Somewhat Isolated (09/12/2022)   Received [facility]   Social Network    How would you rate your social network (family, work, friends)?: Restricted participation with some degree of social isolation  Intimate Partner Violence: Not At Risk (09/12/2022)   Received from [facility]   HITS    Over the last 12 months how often did your partner physically hurt you?: 1    Over the last 12 months how often did your partner insult you or talk down to you?: 1    Over the last 12 months how often did your partner threaten you with physical harm?: 1    Over the last 12 months how  often did your partner scream or curse at you?: 1    Past/failed meds: Copied from previous record: Fluoxetine - not beneficial for mood and behavior. Propranolol - ineffective for headaches  Allergies: No Known Allergies   Immunizations:  There is no immunization history on file for this patient.   Diagnostics/Screenings: Copied from previous record: rEEG - 10/21/2003 - normal awake. W.Hickling, MD   rEEG - 02/06/2010 - normal awake - W.Hickling, MD  Physical Exam: BP 102/66 (BP Location: Left Arm, Patient Position: Sitting, Cuff Size: Large)   Pulse 100   Ht 5' 3.98" (1.625 m)   Wt 194 lb 6.4 oz (88.2 kg)   LMP 12/06/2022 (Approximate)   BMI 33.39 kg/m   General: well developed, well nourished young woman, seated in exam room, in no evident distress Head: normocephalic and atraumatic. Oropharynx difficult to examine but  appears benign. No dysmorphic features. Neck: supple Cardiovascular: regular rate and rhythm, no murmurs. Respiratory: clear to auscultation bilaterally Abdomen: bowel sounds present all four quadrants, abdomen soft, non-tender, non-distended. Musculoskeletal: no skeletal deformities or obvious scoliosis.  Skin: no rashes or neurocutaneous lesions  Neurologic Exam Mental Status: awake and fully alert. Has very limited language.  Smiles responsively. Able to follow only very simple directions Cranial Nerves: fundoscopic exam - red reflex present.  Unable to fully visualize fundus.  Pupils equal briskly reactive to light.  Turns to localize faces and objects in the periphery. Turns to localize sounds in the periphery. Facial movements are symmetric. Motor: normal functional bulk, tone and strength Sensory: withdrawal x 4 Coordination: unable to adequately assess due to patient's inability to participate in examination. No dysmetria when reaching for objects. 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 No recent diagnostic studies to be reviewed with the patient.  Plan until next visit: Continue medications as prescribed  Call for questions or concerns Return in about 6 months (around 06/28/2023).  The medication list was reviewed and reconciled. No changes were made in the prescribed medications today. A complete medication list was provided to the patient.  Allergies as of 12/27/2022   No Known Allergies      Medication List        Accurate as of December 27, 2022 11:59 PM. If you have any questions, ask your nurse or doctor.          diazepam 5 MG tablet Commonly known as: VALIUM Reported on 06/01/2015   levETIRAcetam 750 MG tablet Commonly known as: KEPPRA TAKE 1 TABLET BY MOUTH 2 TIMES DAILY.   levETIRAcetam 250 MG tablet Commonly known as: KEPPRA TAKE 2 TABLETS BY MOUTH TWICE A DAY   multivitamin with minerals Tabs tablet Take 1 tablet by mouth daily.   pantoprazole 20 MG tablet Commonly known as: PROTONIX Take 1 tablet by mouth daily.   topiramate 100 MG tablet Commonly known as: TOPAMAX TAKE 2 TABLETS BY MOUTH TWICE A DAY   Vitamin D (Ergocalciferol) 1.25 MG (50000 UNIT) Caps capsule Commonly known as: DRISDOL Take 50,000 Units by mouth once a week.      Total time spent with the patient was 20 minutes, of which 50% or more was spent in counseling and coordination of care.  [name] NP-C [facility] [facility] [address] [place], [place] [address] Ph. [phone] Fax [phone]

## 2022-12-26 NOTE — Patient Instructions (Signed)
It was a pleasure to see you today!  Instructions for you until your next appointment are as follows: Continue [name]'s medications as prescribed Please sign up for MyChart if you have not done so. Please plan to return for follow up in 6 months or sooner if needed.  Feel free to contact our office during normal business hours at [phone] with questions or concerns. If there is no answer or the call is outside business hours, please leave a message and our clinic staff will call you back within the next business day.  If you have an urgent concern, please stay on the line for our after-hours answering service and ask for the on-call neurologist.     I also encourage you to use MyChart to communicate with me more directly. If you have not yet signed up for MyChart within [facility], the front desk staff can help you. However, please note that this inbox is NOT monitored on nights or weekends, and response can take up to 2 business days.  Urgent matters should be discussed with the on-call pediatric neurologist.   At [facility], we are committed to providing exceptional care. You will receive a patient satisfaction survey through text or email regarding your visit today. Your opinion is important to me. Comments are appreciated.

## 2022-12-27 ENCOUNTER — Ambulatory Visit (INDEPENDENT_AMBULATORY_CARE_PROVIDER_SITE_OTHER): Payer: 59 | Admitting: Family

## 2022-12-27 ENCOUNTER — Encounter (INDEPENDENT_AMBULATORY_CARE_PROVIDER_SITE_OTHER): Payer: Self-pay | Admitting: Family

## 2022-12-27 VITALS — BP 102/66 | HR 100 | Ht 63.98 in | Wt 194.4 lb

## 2022-12-27 DIAGNOSIS — F411 Generalized anxiety disorder: Secondary | ICD-10-CM

## 2022-12-27 DIAGNOSIS — G43009 Migraine without aura, not intractable, without status migrainosus: Secondary | ICD-10-CM

## 2022-12-27 DIAGNOSIS — G40309 Generalized idiopathic epilepsy and epileptic syndromes, not intractable, without status epilepticus: Secondary | ICD-10-CM

## 2022-12-27 DIAGNOSIS — G40209 Localization-related (focal) (partial) symptomatic epilepsy and epileptic syndromes with complex partial seizures, not intractable, without status epilepticus: Secondary | ICD-10-CM

## 2022-12-27 DIAGNOSIS — F840 Autistic disorder: Secondary | ICD-10-CM

## 2022-12-27 DIAGNOSIS — F5105 Insomnia due to other mental disorder: Secondary | ICD-10-CM

## 2022-12-27 DIAGNOSIS — F419 Anxiety disorder, unspecified: Secondary | ICD-10-CM

## 2022-12-27 DIAGNOSIS — G40401 Other generalized epilepsy and epileptic syndromes, not intractable, with status epilepticus: Secondary | ICD-10-CM

## 2022-12-29 ENCOUNTER — Encounter (INDEPENDENT_AMBULATORY_CARE_PROVIDER_SITE_OTHER): Payer: Self-pay | Admitting: Family

## 2023-06-27 ENCOUNTER — Encounter (INDEPENDENT_AMBULATORY_CARE_PROVIDER_SITE_OTHER): Payer: Self-pay | Admitting: Family

## 2023-06-27 ENCOUNTER — Ambulatory Visit (INDEPENDENT_AMBULATORY_CARE_PROVIDER_SITE_OTHER): Payer: 59 | Admitting: Family

## 2023-06-27 VITALS — BP 118/78 | HR 64 | Ht 63.07 in | Wt 190.5 lb

## 2023-06-27 DIAGNOSIS — G40209 Localization-related (focal) (partial) symptomatic epilepsy and epileptic syndromes with complex partial seizures, not intractable, without status epilepticus: Secondary | ICD-10-CM

## 2023-06-27 DIAGNOSIS — F840 Autistic disorder: Secondary | ICD-10-CM | POA: Diagnosis not present

## 2023-06-27 DIAGNOSIS — G40309 Generalized idiopathic epilepsy and epileptic syndromes, not intractable, without status epilepticus: Secondary | ICD-10-CM | POA: Diagnosis not present

## 2023-06-27 NOTE — Patient Instructions (Signed)
 It was a pleasure to see you today!  Instructions for you until your next appointment are as follows: Continue [name]'s medications as prescribed Let me know if she has any seizures or if you have any concerns Keep working on trying to help [name] to lose some weight Please sign up for MyChart if you have not done so. Please plan to return for follow up in 6 months or sooner if needed.  Feel free to contact our office during normal business hours at [phone] with questions or concerns. If there is no answer or the call is outside business hours, please leave a message and our clinic staff will call you back within the next business day.  If you have an urgent concern, please stay on the line for our after-hours answering service and ask for the on-call neurologist.     I also encourage you to use MyChart to communicate with me more directly. If you have not yet signed up for MyChart within [facility], the front desk staff can help you. However, please note that this inbox is NOT monitored on nights or weekends, and response can take up to 2 business days.  Urgent matters should be discussed with the on-call pediatric neurologist.   At [facility], we are committed to providing exceptional care. You will receive a patient satisfaction survey through text or email regarding your visit today. Your opinion is important to me. Comments are appreciated.

## 2023-06-27 NOTE — Progress Notes (Signed)
 [name]   MRN:  [id number]  May 21, 1990   Provider: [name] NP-C Location of Care: [facility]  Visit type: Return visit  Last visit: 12/27/2022  Referral source: Corrington, Kip A, MD History from: Epic chart and patient's mother   Brief history:  Copied from previous record: History of autism, seizures and migraine headaches. She is taking and tolerating Levetiracetam and Topiramate for her seizure disorder and has remained seizure free since October 2011. She has occasional episodes in which she becomes pale and goes to her bed to lie down. These are thought to be migraine because her condition improves after a nap. These occur about once per month. She has had problems with mood and behavior, with screaming and being difficult to console. Being involved in a day program and staying on a regular schedule with daily outings has helped with this in the past.   Today's concerns: Mom reports that [name] has remained seizure free since her last visit.  She continues to be fairly inactive and has to be encouraged to do activities. Mom is working to limit her diet because of concern for weight gain with her lack of activity.  [name] has problems with insomnia at times but it is not problematic. She continues to have intermittent episodes that may be migraine headaches. With these she looks pale, may put her hand to head and will lie down.  [name] has been otherwise generally healthy since she was last seen. No health concerns today other than previously mentioned.  Review of systems: Please see HPI for neurologic and other pertinent review of systems. Otherwise all other systems were reviewed and were negative.  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  She was diagnosed with undifferentiated autism.  She had a normal EEG and CT scan.  I do not know she had chromosomal evaluations.   The patient has been tried with a variety of treatments including vitamin B12, DMG, herbal therapy, angulating therapy as well as other vitamins.  These  have been of marginal benefit to this patient.   She has a history of anxiety and migraines.   School  tested [name] and found that she has an IQ in the range of 50 and undifferentiated autism.  They also noted a mixed receptive and expressive language disorder.    Status epilepticus event May 2006   Status epilepticus event 02/02/2010 that occurred at school.  Surgical history: Past Surgical History:  Procedure Laterality Date   MOUTH SURGERY  Summer 2009   TOE SURGERY Right    toenail    WISDOM TOOTH EXTRACTION       Family history: family history includes Autoimmune disease in  her maternal grandmother and mother; Cancer in her paternal grandmother; Healthy in her brother, brother, brother, brother, father, and sister; Lupus in her maternal aunt and mother; Rheumatologic disease in her mother.   Social history: Social History   Socioeconomic History   Marital status: Single    Spouse name: Not on file   Number of children: Not on file   Years of education: Not on file   Highest education level: Not on file   Occupational History   Not on file  Tobacco Use   Smoking status: Never    Passive exposure: Never   Smokeless tobacco: Never  Vaping Use   Vaping status: Never Used  Substance and Sexual Activity   Alcohol use: No   Drug use: No   Sexual activity: Never  Other Topics Concern   Not on file  Social History Narrative   She graduated from [employer] with a certificate in June 2015. She lives at home with her parents and 2 of her 5 siblings. She enjoys shopping, bowling, going to the park for walks, and watching videos.   Social Drivers of Corporate investment banker Strain: Low Risk  (09/12/2022)   Received from [facility]   Overall Financial Resource Strain (CARDIA)    Difficulty of Paying Living Expenses: Not hard at all  Food Insecurity: No Food Insecurity (09/12/2022)   Received from [facility]   Hunger Vital Sign    Worried About Running Out of Food in the Last Year: Never true    Ran Out of Food in the Last Year: Never true  Transportation Needs: No Transportation Needs (09/12/2022)   Received from [facility] - Transportation    Lack of Transportation (Medical): No    Lack of Transportation (Non-Medical): No  Physical Activity: Unknown (09/12/2022)   Received from [facility]   Exercise Vital Sign    Days of Exercise per Week: 0 days    Minutes of Exercise per Session: Not on file  Stress: No Stress Concern Present (09/12/2022)   Received from [facility] - Occupational Stress Questionnaire    Feeling of Stress : Only a little  Social Connections: Somewhat Isolated (09/12/2022)   Received from [facility]   Social Network    How would you rate your social network (family, work, friends)?: Restricted participation with some degree of social isolation  Intimate Partner Violence: Not At Risk (09/12/2022)   Received from [facility]   HITS    Over the last 12 months how often did your  partner physically hurt you?: Never    Over the last 12 months how often did your partner insult you or talk down to you?: Never    Over the last 12 months how often did your partner threaten you with physical harm?: Never    Over the last 12 months how often did your partner scream or curse at you?: Never    Past/failed meds: Copied from previous record: Fluoxetine - not beneficial for mood and behavior. Propranolol - ineffective for headaches  Allergies: No Known Allergies   Immunizations:  There is no immunization history on file for this patient.   Diagnostics/Screenings: Copied from previous record: rEEG - 10/21/2003 - normal awake. W.Hickling, MD   rEEG - 02/06/2010 - normal awake - W.Hickling, MD  Physical Exam: BP 118/78   Pulse 64   Ht 5' 3.07" (1.602 m)   Wt 190 lb  7.6 oz (86.4 kg)   BMI 33.67 kg/m   Wt Readings from Last 3 Encounters:  06/27/23 190 lb 7.6 oz (86.4 kg)  12/27/22 194 lb 6.4 oz (88.2 kg)  06/25/22 191 lb (86.6 kg)   General: Well developed, well nourished woman, seated on exam table, in no evident distress Head: Head normocephalic and atraumatic.  Oropharynx benign. Neck: Supple Cardiovascular: Regular rate and rhythm, no murmurs Respiratory: Breath sounds clear to auscultation Musculoskeletal: No obvious deformities or scoliosis Skin: No rashes or neurocutaneous lesions  Neurologic Exam Mental Status: Awake and fully alert. Has very limited language. Will say yes or repeat a word when prompted. Can follow some very simple directions Cranial Nerves: Fundoscopic exam reveals red reflex. Pupils equal, briskly reactive to light. Turns to localize faces, objects and sounds in the periphery. Facial movements are symmetric Motor: Normal functional bulk, tone and strength Sensory: Withdrawal x 4 Coordination: No dysmetria with reaching for objects Gait and Station: Normal gait and stance  Impression: Generalized convulsive epilepsy (HCC) - Plan:  topiramate (TOPAMAX) 100 MG tablet  Partial epilepsy with impairment of consciousness (HCC) - Plan: topiramate (TOPAMAX) 100 MG tablet  Active autistic disorder   Recommendations for plan of care: The patient's previous Epic records were reviewed. No recent diagnostic studies to be reviewed with the patient.  Plan until next visit: Continue medications as prescribed  Continue working on limiting portion sizes and snacks to help Vincenzina to lose some weight Call for questions or concerns Return in about 6 months (around 12/25/2023).  The medication list was reviewed and reconciled. No changes were made in the prescribed medications today. A complete medication list was provided to the patient.  Allergies as of 06/27/2023   No Known Allergies      Medication List        Accurate as of June 27, 2023 11:59 PM. If you have any questions, ask your nurse or doctor.          STOP taking these medications    diazepam 5 MG tablet Commonly known as: VALIUM Stopped by: [name]   pantoprazole 20 MG tablet Commonly known as: PROTONIX Stopped by: [name]       TAKE these medications    levETIRAcetam 750 MG tablet Commonly known as: KEPPRA TAKE 1 TABLET BY MOUTH 2 TIMES DAILY. What changed: Another medication with the same name was removed. Continue taking this medication, and follow the directions you see here. Changed by: [name]   metFORMIN 500 MG 24 hr tablet Commonly known as: GLUCOPHAGE-XR Take 500 mg by mouth every morning.   multivitamin with minerals Tabs tablet Take 1 tablet by mouth daily.   topiramate 100 MG tablet Commonly known as: TOPAMAX TAKE 2 TABLETS BY MOUTH TWICE A DAY What changed:  how much to take how to take this when to take this additional instructions Changed by: [name]   Vitamin D (Ergocalciferol) 1.25 MG (50000 UNIT) Caps capsule Commonly known as: DRISDOL Take 50,000 Units by mouth once a week.       Total time spent with the patient was 30 minutes, of which 50% or more was spent in counseling and coordination of care.  [name] NP-C [facility] [address] [place], [place] [address] Ph. [phone] Fax [phone]

## 2023-06-29 ENCOUNTER — Encounter (INDEPENDENT_AMBULATORY_CARE_PROVIDER_SITE_OTHER): Payer: Self-pay | Admitting: Family

## 2023-06-29 MED ORDER — TOPIRAMATE 100 MG PO TABS
ORAL_TABLET | ORAL | 5 refills | Status: DC
Start: 2023-06-29 — End: 2023-12-26

## 2023-08-20 ENCOUNTER — Other Ambulatory Visit (INDEPENDENT_AMBULATORY_CARE_PROVIDER_SITE_OTHER): Payer: Self-pay | Admitting: Family

## 2023-08-20 DIAGNOSIS — G40309 Generalized idiopathic epilepsy and epileptic syndromes, not intractable, without status epilepticus: Secondary | ICD-10-CM

## 2023-08-20 DIAGNOSIS — G40209 Localization-related (focal) (partial) symptomatic epilepsy and epileptic syndromes with complex partial seizures, not intractable, without status epilepticus: Secondary | ICD-10-CM

## 2023-09-24 ENCOUNTER — Other Ambulatory Visit (INDEPENDENT_AMBULATORY_CARE_PROVIDER_SITE_OTHER): Payer: Self-pay | Admitting: Family

## 2023-09-24 DIAGNOSIS — G40209 Localization-related (focal) (partial) symptomatic epilepsy and epileptic syndromes with complex partial seizures, not intractable, without status epilepticus: Secondary | ICD-10-CM

## 2023-09-24 DIAGNOSIS — G40309 Generalized idiopathic epilepsy and epileptic syndromes, not intractable, without status epilepticus: Secondary | ICD-10-CM

## 2023-09-27 ENCOUNTER — Encounter: Payer: Self-pay | Admitting: Podiatry

## 2023-09-27 ENCOUNTER — Ambulatory Visit (INDEPENDENT_AMBULATORY_CARE_PROVIDER_SITE_OTHER): Admitting: Podiatry

## 2023-09-27 DIAGNOSIS — L02612 Cutaneous abscess of left foot: Secondary | ICD-10-CM

## 2023-09-27 DIAGNOSIS — L600 Ingrowing nail: Secondary | ICD-10-CM

## 2023-09-27 DIAGNOSIS — L03032 Cellulitis of left toe: Secondary | ICD-10-CM | POA: Diagnosis not present

## 2023-09-27 MED ORDER — CEPHALEXIN 500 MG PO CAPS: 1000.0000 mg | ORAL_CAPSULE | Freq: Two times a day (BID) | ORAL | 0 refills | Status: AC

## 2023-09-27 NOTE — Patient Instructions (Signed)

## 2023-09-27 NOTE — Progress Notes (Signed)
 Patient presents with her mother today.  She is nonverbal.  They have noticed drainage from the lateral aspect aspect of the hallux nail on the left and is also noticed some on the lower aspect.  She had borders removed before but the mother says the nail was curved over a lot more since then.   Physical exam:  General appearance: Pleasant, and in no acute distress. AOx3.  Vascular: Pedal pulses: DP 2/4, PT 2/4.  Mild edema lower legs bilaterally. Capillary fill time immediate.  Neurological: Light touch intact feet bilaterally.  Normal Achilles reflex bilaterally.  No clonus or spasticity noted.   Dermatologic:   Ingrown medial and lateral nail borders on the hallux nail left.  There is crusting swelling and redness along the nail borders.  Incurvated nail borders.  Nail has severe curvature to it.  Skin normal temperature bilaterally.  Skin normal color, tone, and texture bilaterally.   Musculoskeletal: Tenderness hallux left.  Pain on the distal tip of the toe.  Radiographs:   Diagnosis: 1.  Ingrown nail hallux left 2.  Cellulitis great toe left  Plan: -Established office visit level 3 for evaluation and management. - Discussed ingrown nail along the borders of the hallux.  Will place her on antibiotic and get inflammation down for 2 weeks and also have her soak it.  We will see her in 2 to 3 weeks for matricectomy medial lateral nail border hallux left - Rx Keflex  500 mg 2 p.o. twice daily for 10 days -Gave soap instructions and wound care instructions.   Return several weeks for matrixectomy medial and lateral nail borders hallux left

## 2023-10-10 ENCOUNTER — Ambulatory Visit (INDEPENDENT_AMBULATORY_CARE_PROVIDER_SITE_OTHER): Payer: MEDICAID | Admitting: Podiatry

## 2023-10-10 DIAGNOSIS — L600 Ingrowing nail: Secondary | ICD-10-CM

## 2023-10-10 NOTE — Progress Notes (Signed)
 Patient complains of painful ingrown both borders border(s) toe 1 LT. Patient denies fevers, chills, nausea, vomiting.  Objective:  Vitals: Reviewed  General: Well developed, nourished, in no acute distress, alert and oriented x3   Vascular: DP pulse 2/4 bilateral. PT pulse 2/4 bilateral.  No edema except for some of the great toe of the left.  Dermatology: Erythema, edema, incurvated nail border both borders hallux left with slight clear drainage . Tenderness present with palpation. Normal skin tone and texture feet with normal hair growth.  Neurological: Grossly intact. Normal reflexes.   Musculoskeletal: Tenderness with palpation of the distal hallux left. No tenderness or painful ROM at IPJ.  Diagnosis: Ingrown nail both borders hallux left  Plan: -discussed etiology and treatment of ingrown nails. Discussed surgical vs conservative treatment. -Consent signed for appropriate matrixectomy affected nail(s).  Procedure(s):   - Matrixectomy(s) both borders hallux left: Toe anesthetized with 3cc 2:1 mixture 2% Lidocaine with epinephrine: Sodium Bicarbonate. Surgical site prepped. Digital tourniquet applied.  Avulsion of nail borders. performed. Matrixecomy performed with three 30 second applications of phenol to nail matrix. Site irrigated with alcohol.  Tourniquet released with good vascularity noticed in digit.  Applied triple antibiotic to nailbed and applied gauze and Coban dressing. - Written and oral postoperative instructions given.  -Return for post-op 2 weeks.  Baker Bon, DPM

## 2023-10-10 NOTE — Patient Instructions (Signed)

## 2023-10-17 ENCOUNTER — Telehealth: Payer: Self-pay | Admitting: Podiatry

## 2023-10-17 NOTE — Telephone Encounter (Signed)
 Mom called concerned her daughter had surgery and now toe is red. Patients follow up is not for another week. Mom is concerned would like to know if this is normal.

## 2023-10-18 MED ORDER — CEPHALEXIN 500 MG PO CAPS: 1000.0000 mg | ORAL_CAPSULE | Freq: Two times a day (BID) | ORAL | 0 refills | Status: AC

## 2023-10-18 NOTE — Addendum Note (Signed)
 Addended by: Reche Canales on: 10/18/2023 08:17 AM   Modules accepted: Orders

## 2023-10-24 ENCOUNTER — Ambulatory Visit: Admitting: Podiatry

## 2023-10-24 ENCOUNTER — Encounter: Payer: Self-pay | Admitting: Podiatry

## 2023-10-24 DIAGNOSIS — L600 Ingrowing nail: Secondary | ICD-10-CM

## 2023-10-24 NOTE — Progress Notes (Signed)
 Patient presents follow-up nail surgery left great toe.  No complaints.  Physical exam:  Dermatologic: Nail surgery site for matrixectomy healing well with no signs of infection.  Diagnosis: 1.  Status post matrixectomy toe.  Healing well  Plan: - POV status post nail surgery hallux right if patient has any problems she can call for appointment otherwise we can see her as needed - Return as needed

## 2023-12-25 NOTE — Progress Notes (Unsigned)
 [name]   MRN:  [id number]  07-07-1990   Provider: Ellouise Bollman NP-C Location of Care: [facility]  Visit type: Return visit  Last visit: 06/27/2023  Referral source: Corrington, Kip A, MD History from: Epic chart and patient's mother  Brief history:  Copied from previous record: History of autism, seizures and migraine headaches. She is taking and tolerating Levetiracetam  and Topiramate  for her seizure disorder and has remained seizure free since October 2011. She has occasional episodes in which she becomes pale and goes to her bed to lie down. These are thought to be migraine because her condition improves after a nap. These occur about once per month. She has had problems with mood and behavior, with screaming and being difficult to console. Being involved in a day program and staying on a regular schedule with daily outings has helped with this in the past.   Today's concerns: Mom reports that [name] has remained seizure free since her last visit. She had an ear infection and was prescribed an antibiotic but did not experience breakthrough seizures Angeleah is taking Metformin for insulin resistance. Mom works to provide her with a healthy diet and tries to keep her active.  She frequently complains of headache. Mom says that sometimes she can be distracted from the complaints so it is not clear if she is experiencing pain or repeating the word. She has not had bouts of pallor or nausea and vomiting.  Behavior has not been problematic Sieanna has been otherwise generally healthy since she was last seen. No health concerns today other than previously mentioned.  Review of systems: Please see HPI for neurologic and other pertinent review of systems. Otherwise all other systems were reviewed and were negative.  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  She was diagnosed with undifferentiated autism.  She had a normal EEG and CT scan.  I do not know she had chromosomal evaluations.   The patient has been tried with a variety of treatments including vitamin B12, DMG, herbal therapy, angulating therapy as well as other vitamins.  These  have been of marginal benefit to this patient.   She has a history of anxiety and migraines.   School  tested [name] and found that she has an IQ in the range of 50 and undifferentiated autism.  They also noted a mixed receptive and expressive language disorder.    Status epilepticus event May 2006   Status epilepticus event 02/02/2010 that occurred at school.  Surgical history: Past Surgical History:  Procedure Laterality Date   MOUTH SURGERY  Summer 2009   TOE SURGERY Right    toenail    WISDOM TOOTH EXTRACTION  Family history: family history includes Autoimmune disease in her maternal grandmother and mother; Cancer in her paternal grandmother; Healthy in her brother, brother, brother, brother, father, and sister; Lupus in her maternal aunt and mother; Rheumatologic disease in her mother.   Social history: Social History   Socioeconomic History   Marital status: Single    Spouse name: Not on file   Number of children: Not  on file   Years of education: Not on file   Highest education level: Not on file  Occupational History   Not on file  Tobacco Use   Smoking status: Never    Passive exposure: Never   Smokeless tobacco: Never  Vaping Use   Vaping status: Never Used  Substance and Sexual Activity   Alcohol use: No   Drug use: No   Sexual activity: Never  Other Topics Concern   Not on file  Social History Narrative   She graduated from [employer] with a certificate in June 2015. She lives at home with her parents and 2 of her 5 siblings. She enjoys shopping, bowling, going to the park for walks, and watching videos.   Social Drivers of Health   Financial Resource Strain: Patient Declined (12/03/2023)   Received from [facility]   Overall Financial Resource Strain (CARDIA)    How hard is it for you to pay for the very basics like food, housing, medical care, and heating?: Patient declined  Food Insecurity: No Food Insecurity (12/03/2023)   Received from [facility]   Hunger Vital Sign    Within the past 12 months, you worried that your food would run out before you got the money to buy more.: Never true    Within the past 12 months, the food you bought just didn't last and you didn't have money to get more.: Never true  Transportation Needs: No Transportation Needs (12/03/2023)   Received from [facility] - Transportation    In the past 12 months, has lack of transportation kept you from medical appointments or from getting medications?: No    In the past 12 months, has lack of transportation kept you from meetings, work, or from getting things needed for daily living?: No  Physical Activity: Inactive (12/03/2023)   Received from [facility]   Exercise Vital Sign    On average, how many days per week do you engage in moderate to strenuous exercise (like a brisk walk)?: 0 days    Minutes of Exercise per Session: Not on file  Stress: Patient Declined (12/03/2023)    Received from [facility] - Occupational Stress Questionnaire    Do you feel stress - tense, restless, nervous, or anxious, or unable to sleep at night because your mind is troubled all the time - these days?: Patient declined  Social Connections: Patient Declined (12/03/2023)   Received from [facility]   Social Network    How would you rate your social network (family, work, friends)?: Patient declined  Intimate Partner Violence: Not At Risk (12/03/2023)   Received from [facility]   HITS    Over the last 12 months how often did your partner physically hurt you?: Never    Over the last 12 months how often did your partner insult you or talk down to you?: Never    Over the last 12 months how often did your partner threaten you with physical harm?: Never    Over the  last 12 months how often did your partner scream or curse at you?: Never    Past/failed meds: Copied from previous record: Fluoxetine  - not beneficial for mood and behavior. Propranolol  - ineffective for headaches   Allergies: No Known Allergies   Immunizations:  There is no immunization history on file for this patient.   Diagnostics/Screenings: Copied from previous record: rEEG - 10/21/2003 - normal awake. W.Hickling, MD   rEEG - 02/06/2010 - normal awake - W.Hickling, MD  Physical Exam: BP 118/80   Pulse 98   Ht 5' 2.99 (1.6 m)   Wt 185 lb (83.9 kg)   BMI 32.78 kg/m   General: Well developed, well nourished woman, seated on exam table, in no evident distress Head: Head normocephalic and atraumatic.  Oropharynx benign. Neck: Supple Cardiovascular: Regular rate and rhythm, no murmurs Respiratory: Breath sounds clear to auscultation Musculoskeletal: No obvious deformities or scoliosis Skin: No rashes or neurocutaneous lesions  Neurologic Exam Mental Status: Awake and fully alert. Variable eye contact. No spontaneous language. Responded in a few one word answers  when prompted by Mom. Able to follow a few very basic commands.  Cranial Nerves: Fundoscopic exam reveals sharp disc margins.  Pupils equal, briskly reactive to light. Turns to Face tongue, palate move normally and symmetrically. Motor: Normal functional bulk, tone and strength Sensory: Withdrawal x 4 Coordination: No dysmetria with reach for objects Gait and Station: Arises from chair without difficulty.  Stance is normal. Gait demonstrates normal stride length and balance.   Impression: Generalized convulsive epilepsy (HCC) - Plan: levETIRAcetam  (KEPPRA ) 750 MG tablet, topiramate  (TOPAMAX ) 100 MG tablet  Partial epilepsy with impairment of consciousness (HCC) - Plan: levETIRAcetam  (KEPPRA ) 750 MG tablet, topiramate  (TOPAMAX ) 100 MG tablet  Active autistic disorder  Migraine without aura and without status migrainosus, not intractable   Recommendations for plan of care: The patient's previous Epic records were reviewed. No recent diagnostic studies to be reviewed with the patient. Shenica is doing well at this time. I will make no changes in her treatment plan.   Plan until next visit: Continue medications as prescribed  Call for questions or concerns Return in about 6 months (around 06/27/2024).  The medication list was reviewed and reconciled. No changes were made in the prescribed medications today. A complete medication list was provided to the patient.  Allergies as of 12/26/2023   No Known Allergies      Medication List        Accurate as of December 26, 2023  1:02 PM. If you have any questions, ask your nurse or doctor.          STOP taking these medications    Cephalexin  500 MG tablet Stopped by: Ellouise Bollman       TAKE these medications    levETIRAcetam  750 MG tablet Commonly known as: KEPPRA  Take 1 tablet (750 mg total) by mouth 2 (two) times daily.   metFORMIN 500 MG 24 hr tablet Commonly known as: GLUCOPHAGE-XR Take 500 mg by mouth every morning.    multivitamin with minerals Tabs tablet Take 1 tablet by mouth daily.   topiramate  100 MG tablet Commonly known as: TOPAMAX  TAKE 2 TABLETS BY MOUTH TWICE A DAY   Vitamin D (Ergocalciferol) 1.25 MG (50000 UNIT) Caps capsule Commonly known as: DRISDOL Take 50,000 Units by mouth once a week.      Total time spent with the patient was 25 minutes, of which 50% or more was spent in counseling and coordination of care.  Ellouise Bollman NP-C [facility] [address], [address] [place], [place] [address] Ph. [phone] Fax [phone]

## 2023-12-26 ENCOUNTER — Ambulatory Visit (INDEPENDENT_AMBULATORY_CARE_PROVIDER_SITE_OTHER): Payer: Self-pay | Admitting: Family

## 2023-12-26 ENCOUNTER — Encounter (INDEPENDENT_AMBULATORY_CARE_PROVIDER_SITE_OTHER): Payer: Self-pay | Admitting: Family

## 2023-12-26 VITALS — BP 118/80 | HR 98 | Ht 62.99 in | Wt 185.0 lb

## 2023-12-26 DIAGNOSIS — F840 Autistic disorder: Secondary | ICD-10-CM | POA: Diagnosis not present

## 2023-12-26 DIAGNOSIS — G40209 Localization-related (focal) (partial) symptomatic epilepsy and epileptic syndromes with complex partial seizures, not intractable, without status epilepticus: Secondary | ICD-10-CM | POA: Diagnosis not present

## 2023-12-26 DIAGNOSIS — G43009 Migraine without aura, not intractable, without status migrainosus: Secondary | ICD-10-CM | POA: Diagnosis not present

## 2023-12-26 DIAGNOSIS — G40309 Generalized idiopathic epilepsy and epileptic syndromes, not intractable, without status epilepticus: Secondary | ICD-10-CM

## 2023-12-26 MED ORDER — LEVETIRACETAM 750 MG PO TABS
750.0000 mg | ORAL_TABLET | Freq: Two times a day (BID) | ORAL | 5 refills | Status: AC
Start: 2023-12-26 — End: ?

## 2023-12-26 MED ORDER — TOPIRAMATE 100 MG PO TABS
ORAL_TABLET | ORAL | 5 refills | Status: AC
Start: 2023-12-26 — End: ?

## 2023-12-26 NOTE — Patient Instructions (Signed)
 It was a pleasure to see you today!  Instructions for you until your next appointment are as follows: Continue Aliyyah's medications as prescribed Let me know if seizures occur or if you have any other concerns Please sign up for MyChart if you have not done so. Please plan to return for follow up in 6 months or sooner if needed.  Feel free to contact our office during normal business hours at [phone] with questions or concerns. If there is no answer or the call is outside business hours, please leave a message and our clinic staff will call you back within the next business day.  If you have an urgent concern, please stay on the line for our after-hours answering service and ask for the on-call neurologist.     I also encourage you to use MyChart to communicate with me more directly. If you have not yet signed up for MyChart within [facility], the front desk staff can help you. However, please note that this inbox is NOT monitored on nights or weekends, and response can take up to 2 business days.  Urgent matters should be discussed with the on-call pediatric neurologist.   At [facility], we are committed to providing exceptional care. You will receive a patient satisfaction survey through text or email regarding your visit today. Your opinion is important to me. Comments are appreciated.

## 2024-06-29 ENCOUNTER — Ambulatory Visit (INDEPENDENT_AMBULATORY_CARE_PROVIDER_SITE_OTHER): Admitting: Family
